# Patient Record
Sex: Female | Born: 1960 | Race: Black or African American | Hispanic: No | Smoking: Never smoker
Health system: Southern US, Community
[De-identification: ages and names within clinical notes are randomized; demographics above are authoritative.]

## PROBLEM LIST (undated history)

## (undated) DIAGNOSIS — Z800 Family history of malignant neoplasm of digestive organs: Secondary | ICD-10-CM

## (undated) DIAGNOSIS — I10 Essential (primary) hypertension: Secondary | ICD-10-CM

## (undated) DIAGNOSIS — Z803 Family history of malignant neoplasm of breast: Secondary | ICD-10-CM

## (undated) DIAGNOSIS — I499 Cardiac arrhythmia, unspecified: Secondary | ICD-10-CM

## (undated) DIAGNOSIS — Z8481 Family history of carrier of genetic disease: Secondary | ICD-10-CM

## (undated) HISTORY — PX: UTERINE FIBROID SURGERY: SHX826

## (undated) HISTORY — PX: HEMORROIDECTOMY: SUR656

## (undated) HISTORY — DX: Family history of carrier of genetic disease: Z84.81

## (undated) HISTORY — DX: Family history of malignant neoplasm of breast: Z80.3

## (undated) HISTORY — PX: REDUCTION MAMMAPLASTY: SUR839

## (undated) HISTORY — DX: Family history of malignant neoplasm of digestive organs: Z80.0

## (undated) HISTORY — PX: EYE SURGERY: SHX253

---

## 1997-11-25 ENCOUNTER — Ambulatory Visit (HOSPITAL_COMMUNITY): Admission: RE | Admit: 1997-11-25 | Discharge: 1997-11-25 | Payer: Self-pay | Admitting: Obstetrics and Gynecology

## 1998-03-11 ENCOUNTER — Inpatient Hospital Stay (HOSPITAL_COMMUNITY): Admission: RE | Admit: 1998-03-11 | Discharge: 1998-03-13 | Payer: Self-pay | Admitting: Obstetrics and Gynecology

## 1998-05-07 ENCOUNTER — Ambulatory Visit (HOSPITAL_COMMUNITY): Admission: RE | Admit: 1998-05-07 | Discharge: 1998-05-07 | Payer: Self-pay | Admitting: Gastroenterology

## 1998-09-03 ENCOUNTER — Emergency Department (HOSPITAL_COMMUNITY): Admission: EM | Admit: 1998-09-03 | Discharge: 1998-09-03 | Payer: Self-pay | Admitting: Emergency Medicine

## 1998-09-03 ENCOUNTER — Encounter: Payer: Self-pay | Admitting: Emergency Medicine

## 1998-09-25 ENCOUNTER — Encounter: Admission: RE | Admit: 1998-09-25 | Discharge: 1998-10-22 | Payer: Self-pay | Admitting: Specialist

## 1998-10-20 ENCOUNTER — Observation Stay (HOSPITAL_COMMUNITY): Admission: RE | Admit: 1998-10-20 | Discharge: 1998-10-21 | Payer: Self-pay | Admitting: General Surgery

## 1999-04-08 ENCOUNTER — Other Ambulatory Visit: Admission: RE | Admit: 1999-04-08 | Discharge: 1999-04-08 | Payer: Self-pay | Admitting: Obstetrics and Gynecology

## 2000-05-18 ENCOUNTER — Ambulatory Visit (HOSPITAL_COMMUNITY): Admission: RE | Admit: 2000-05-18 | Discharge: 2000-05-18 | Payer: Self-pay

## 2002-06-15 ENCOUNTER — Ambulatory Visit (HOSPITAL_COMMUNITY): Admission: RE | Admit: 2002-06-15 | Discharge: 2002-06-15 | Payer: Self-pay | Admitting: Family Medicine

## 2002-07-05 ENCOUNTER — Ambulatory Visit (HOSPITAL_COMMUNITY): Admission: RE | Admit: 2002-07-05 | Discharge: 2002-07-05 | Payer: Self-pay | Admitting: Obstetrics & Gynecology

## 2002-07-05 ENCOUNTER — Encounter: Payer: Self-pay | Admitting: Obstetrics & Gynecology

## 2002-09-19 ENCOUNTER — Encounter: Payer: Self-pay | Admitting: Family Medicine

## 2002-09-19 ENCOUNTER — Ambulatory Visit (HOSPITAL_COMMUNITY): Admission: RE | Admit: 2002-09-19 | Discharge: 2002-09-19 | Payer: Self-pay | Admitting: Family Medicine

## 2003-09-24 ENCOUNTER — Ambulatory Visit (HOSPITAL_COMMUNITY): Admission: RE | Admit: 2003-09-24 | Discharge: 2003-09-24 | Payer: Self-pay | Admitting: Obstetrics and Gynecology

## 2003-09-26 ENCOUNTER — Other Ambulatory Visit: Admission: RE | Admit: 2003-09-26 | Discharge: 2003-09-26 | Payer: Self-pay | Admitting: Family Medicine

## 2004-06-08 ENCOUNTER — Inpatient Hospital Stay (HOSPITAL_COMMUNITY): Admission: AD | Admit: 2004-06-08 | Discharge: 2004-06-08 | Payer: Self-pay | Admitting: Obstetrics & Gynecology

## 2005-08-11 ENCOUNTER — Encounter: Admission: RE | Admit: 2005-08-11 | Discharge: 2005-08-11 | Payer: Self-pay | Admitting: Obstetrics and Gynecology

## 2005-11-15 ENCOUNTER — Encounter: Admission: RE | Admit: 2005-11-15 | Discharge: 2005-11-15 | Payer: Self-pay | Admitting: Internal Medicine

## 2006-04-09 ENCOUNTER — Emergency Department (HOSPITAL_COMMUNITY): Admission: EM | Admit: 2006-04-09 | Discharge: 2006-04-09 | Payer: Self-pay | Admitting: Emergency Medicine

## 2006-12-29 ENCOUNTER — Encounter: Admission: RE | Admit: 2006-12-29 | Discharge: 2006-12-29 | Payer: Self-pay | Admitting: Obstetrics and Gynecology

## 2007-03-13 ENCOUNTER — Emergency Department (HOSPITAL_COMMUNITY): Admission: EM | Admit: 2007-03-13 | Discharge: 2007-03-13 | Payer: Self-pay | Admitting: *Deleted

## 2008-10-10 ENCOUNTER — Emergency Department (HOSPITAL_COMMUNITY): Admission: EM | Admit: 2008-10-10 | Discharge: 2008-10-10 | Payer: Self-pay | Admitting: Emergency Medicine

## 2009-10-29 ENCOUNTER — Encounter: Admission: RE | Admit: 2009-10-29 | Discharge: 2009-10-29 | Payer: Self-pay | Admitting: Obstetrics and Gynecology

## 2009-11-11 ENCOUNTER — Encounter: Admission: RE | Admit: 2009-11-11 | Discharge: 2009-11-11 | Payer: Self-pay | Admitting: Obstetrics and Gynecology

## 2009-11-13 ENCOUNTER — Encounter: Admission: RE | Admit: 2009-11-13 | Discharge: 2009-11-13 | Payer: Self-pay | Admitting: Obstetrics and Gynecology

## 2009-12-31 ENCOUNTER — Ambulatory Visit (HOSPITAL_COMMUNITY): Admission: RE | Admit: 2009-12-31 | Discharge: 2009-12-31 | Payer: Self-pay | Admitting: Interventional Radiology

## 2010-01-01 ENCOUNTER — Ambulatory Visit (HOSPITAL_COMMUNITY): Admission: RE | Admit: 2010-01-01 | Discharge: 2010-01-02 | Payer: Self-pay | Admitting: Interventional Radiology

## 2010-01-21 ENCOUNTER — Encounter: Admission: RE | Admit: 2010-01-21 | Discharge: 2010-01-21 | Payer: Self-pay | Admitting: Interventional Radiology

## 2010-07-18 ENCOUNTER — Encounter: Payer: Self-pay | Admitting: Internal Medicine

## 2010-07-19 ENCOUNTER — Encounter: Payer: Self-pay | Admitting: Interventional Radiology

## 2010-07-22 ENCOUNTER — Encounter
Admission: RE | Admit: 2010-07-22 | Discharge: 2010-07-22 | Payer: Self-pay | Source: Home / Self Care | Attending: Obstetrics and Gynecology | Admitting: Obstetrics and Gynecology

## 2010-07-22 ENCOUNTER — Encounter
Admission: RE | Admit: 2010-07-22 | Discharge: 2010-07-22 | Payer: Self-pay | Source: Home / Self Care | Attending: Interventional Radiology | Admitting: Interventional Radiology

## 2010-09-13 LAB — CBC
HCT: 39.5 % (ref 36.0–46.0)
Hemoglobin: 13.7 g/dL (ref 12.0–15.0)
MCH: 30.4 pg (ref 26.0–34.0)
MCHC: 34.7 g/dL (ref 30.0–36.0)
MCV: 87.8 fL (ref 78.0–100.0)
Platelets: 247 10*3/uL (ref 150–400)
RBC: 4.49 MIL/uL (ref 3.87–5.11)
RDW: 12.3 % (ref 11.5–15.5)
WBC: 4.6 10*3/uL (ref 4.0–10.5)

## 2010-09-13 LAB — HCG, SERUM, QUALITATIVE: Preg, Serum: NEGATIVE

## 2010-09-13 LAB — CREATININE, SERUM
Creatinine, Ser: 0.74 mg/dL (ref 0.4–1.2)
GFR calc Af Amer: >60 mL/min (ref >60)
GFR calc non Af Amer: >60 mL/min (ref >60)

## 2010-10-07 LAB — DIFFERENTIAL
Basophils Absolute: 0 10*3/uL (ref 0.0–0.1)
Basophils Relative: 0 % (ref 0–1)
Eosinophils Absolute: 0.1 10*3/uL (ref 0.0–0.7)
Eosinophils Relative: 1 % (ref 0–5)
Lymphocytes Relative: 15 % (ref 12–46)
Lymphs Abs: 1.1 10*3/uL (ref 0.7–4.0)
Monocytes Absolute: 0.8 10*3/uL (ref 0.1–1.0)
Monocytes Relative: 10 % (ref 3–12)
Neutro Abs: 5.7 10*3/uL (ref 1.7–7.7)
Neutrophils Relative %: 74 % (ref 43–77)

## 2010-10-07 LAB — COMPREHENSIVE METABOLIC PANEL
ALT: 30 U/L (ref 0–35)
AST: 18 U/L (ref 0–37)
Albumin: 3.3 g/dL — ABNORMAL LOW (ref 3.5–5.2)
Alkaline Phosphatase: 105 U/L (ref 39–117)
BUN: 11 mg/dL (ref 6–23)
CO2: 26 mEq/L (ref 19–32)
Calcium: 8.7 mg/dL (ref 8.4–10.5)
Chloride: 106 mEq/L (ref 96–112)
Creatinine, Ser: 0.76 mg/dL (ref 0.4–1.2)
GFR calc Af Amer: >60 mL/min (ref >60)
GFR calc non Af Amer: >60 mL/min (ref >60)
Glucose, Bld: 103 mg/dL — ABNORMAL HIGH (ref 70–99)
Potassium: 3.3 mEq/L — ABNORMAL LOW (ref 3.5–5.1)
Sodium: 139 mEq/L (ref 135–145)
Total Bilirubin: 0.6 mg/dL (ref 0.3–1.2)
Total Protein: 6.4 g/dL (ref 6.0–8.3)

## 2010-10-07 LAB — URINE MICROSCOPIC-ADD ON

## 2010-10-07 LAB — URINALYSIS, ROUTINE W REFLEX MICROSCOPIC
Bilirubin Urine: NEGATIVE
Glucose, UA: NEGATIVE mg/dL
Hgb urine dipstick: NEGATIVE
Ketones, ur: NEGATIVE mg/dL
Nitrite: NEGATIVE
Protein, ur: NEGATIVE mg/dL
Specific Gravity, Urine: 1.017 (ref 1.005–1.030)
Urobilinogen, UA: 0.2 mg/dL (ref 0.0–1.0)
pH: 6 (ref 5.0–8.0)

## 2010-10-07 LAB — CBC
HCT: 37.5 % (ref 36.0–46.0)
Hemoglobin: 12.9 g/dL (ref 12.0–15.0)
MCHC: 34.3 g/dL (ref 30.0–36.0)
MCV: 87.8 fL (ref 78.0–100.0)
Platelets: 225 10*3/uL (ref 150–400)
RBC: 4.27 MIL/uL (ref 3.87–5.11)
RDW: 12.4 % (ref 11.5–15.5)
WBC: 7.7 10*3/uL (ref 4.0–10.5)

## 2010-10-07 LAB — WET PREP, GENITAL
Clue Cells Wet Prep HPF POC: NONE SEEN
Trich, Wet Prep: NONE SEEN
Yeast Wet Prep HPF POC: NONE SEEN

## 2010-10-07 LAB — PREGNANCY, URINE: Preg Test, Ur: NEGATIVE

## 2010-10-07 LAB — GC/CHLAMYDIA PROBE AMP, GENITAL
Chlamydia, DNA Probe: NEGATIVE
GC Probe Amp, Genital: NEGATIVE

## 2010-10-19 ENCOUNTER — Other Ambulatory Visit (HOSPITAL_COMMUNITY): Payer: Self-pay | Admitting: Obstetrics and Gynecology

## 2010-10-19 ENCOUNTER — Other Ambulatory Visit: Payer: Self-pay | Admitting: Obstetrics and Gynecology

## 2010-10-19 DIAGNOSIS — Z1231 Encounter for screening mammogram for malignant neoplasm of breast: Secondary | ICD-10-CM

## 2010-11-02 ENCOUNTER — Ambulatory Visit (HOSPITAL_COMMUNITY): Payer: Self-pay

## 2010-11-02 ENCOUNTER — Ambulatory Visit
Admission: RE | Admit: 2010-11-02 | Discharge: 2010-11-02 | Disposition: A | Discharge status: Home / Self Care | Payer: BC Managed Care – PPO | Source: Ambulatory Visit | Attending: Obstetrics and Gynecology | Admitting: Obstetrics and Gynecology

## 2010-11-02 DIAGNOSIS — Z1231 Encounter for screening mammogram for malignant neoplasm of breast: Secondary | ICD-10-CM

## 2011-01-14 ENCOUNTER — Inpatient Hospital Stay (INDEPENDENT_AMBULATORY_CARE_PROVIDER_SITE_OTHER)
Admission: RE | Admit: 2011-01-14 | Discharge: 2011-01-14 | Disposition: A | Discharge status: Home / Self Care | Payer: BC Managed Care – PPO | Source: Ambulatory Visit | Attending: Emergency Medicine | Admitting: Emergency Medicine

## 2011-01-14 DIAGNOSIS — R51 Headache: Secondary | ICD-10-CM

## 2011-01-14 DIAGNOSIS — I10 Essential (primary) hypertension: Secondary | ICD-10-CM

## 2012-12-20 ENCOUNTER — Other Ambulatory Visit: Payer: Self-pay

## 2014-02-14 ENCOUNTER — Other Ambulatory Visit (HOSPITAL_COMMUNITY)
Admission: RE | Admit: 2014-02-14 | Discharge: 2014-02-14 | Disposition: A | Discharge status: Home / Self Care | Payer: BC Managed Care – PPO | Source: Ambulatory Visit | Attending: Family Medicine | Admitting: Family Medicine

## 2014-02-14 DIAGNOSIS — Z01419 Encounter for gynecological examination (general) (routine) without abnormal findings: Secondary | ICD-10-CM | POA: Diagnosis present

## 2014-02-14 DIAGNOSIS — Z113 Encounter for screening for infections with a predominantly sexual mode of transmission: Secondary | ICD-10-CM | POA: Insufficient documentation

## 2014-02-14 DIAGNOSIS — N760 Acute vaginitis: Secondary | ICD-10-CM | POA: Diagnosis present

## 2014-02-14 DIAGNOSIS — Z1151 Encounter for screening for human papillomavirus (HPV): Secondary | ICD-10-CM | POA: Insufficient documentation

## 2014-03-09 ENCOUNTER — Other Ambulatory Visit: Payer: BC Managed Care – PPO | Admitting: Family Medicine

## 2014-03-09 ENCOUNTER — Other Ambulatory Visit (HOSPITAL_COMMUNITY)
Admission: RE | Admit: 2014-03-09 | Discharge: 2014-03-09 | Disposition: A | Discharge status: Home / Self Care | Payer: BC Managed Care – PPO | Source: Ambulatory Visit | Attending: Family Medicine | Admitting: Family Medicine

## 2014-03-09 DIAGNOSIS — N760 Acute vaginitis: Secondary | ICD-10-CM | POA: Diagnosis present

## 2014-03-09 DIAGNOSIS — Z113 Encounter for screening for infections with a predominantly sexual mode of transmission: Secondary | ICD-10-CM | POA: Insufficient documentation

## 2014-03-18 ENCOUNTER — Other Ambulatory Visit: Payer: Self-pay

## 2014-03-18 DIAGNOSIS — N644 Mastodynia: Secondary | ICD-10-CM

## 2014-03-18 LAB — URINE CYTOLOGY ANCILLARY ONLY
Bacterial vaginitis: NEGATIVE
Candida vaginitis: NEGATIVE
Chlamydia: NEGATIVE
Neisseria Gonorrhea: NEGATIVE
Trichomonas: NEGATIVE

## 2014-04-08 ENCOUNTER — Other Ambulatory Visit: Payer: Self-pay

## 2014-04-08 DIAGNOSIS — Z1231 Encounter for screening mammogram for malignant neoplasm of breast: Secondary | ICD-10-CM

## 2014-04-18 ENCOUNTER — Other Ambulatory Visit: Payer: Self-pay

## 2014-04-18 DIAGNOSIS — Z1231 Encounter for screening mammogram for malignant neoplasm of breast: Secondary | ICD-10-CM

## 2014-04-22 ENCOUNTER — Ambulatory Visit: Payer: BC Managed Care – PPO

## 2014-04-24 ENCOUNTER — Ambulatory Visit
Admission: RE | Admit: 2014-04-24 | Discharge: 2014-04-24 | Disposition: A | Discharge status: Home / Self Care | Payer: BC Managed Care – PPO | Source: Ambulatory Visit

## 2014-04-24 DIAGNOSIS — Z1231 Encounter for screening mammogram for malignant neoplasm of breast: Secondary | ICD-10-CM

## 2014-10-21 ENCOUNTER — Other Ambulatory Visit: Payer: BLUE CROSS/BLUE SHIELD | Admitting: Family Medicine

## 2014-10-21 ENCOUNTER — Other Ambulatory Visit (HOSPITAL_COMMUNITY)
Admission: RE | Admit: 2014-10-21 | Discharge: 2014-10-21 | Disposition: A | Discharge status: Home / Self Care | Payer: BLUE CROSS/BLUE SHIELD | Source: Ambulatory Visit | Attending: Family Medicine | Admitting: Family Medicine

## 2014-10-21 DIAGNOSIS — N760 Acute vaginitis: Secondary | ICD-10-CM | POA: Insufficient documentation

## 2014-10-21 DIAGNOSIS — Z113 Encounter for screening for infections with a predominantly sexual mode of transmission: Secondary | ICD-10-CM | POA: Diagnosis present

## 2014-10-23 LAB — URINE CYTOLOGY ANCILLARY ONLY
Bacterial vaginitis: POSITIVE — AB
Candida vaginitis: NEGATIVE
Chlamydia: NEGATIVE
Neisseria Gonorrhea: NEGATIVE
Trichomonas: NEGATIVE

## 2016-04-13 ENCOUNTER — Other Ambulatory Visit: Payer: Self-pay | Admitting: Family Medicine

## 2016-04-13 DIAGNOSIS — Z1231 Encounter for screening mammogram for malignant neoplasm of breast: Secondary | ICD-10-CM

## 2016-05-03 ENCOUNTER — Ambulatory Visit
Admission: RE | Admit: 2016-05-03 | Discharge: 2016-05-03 | Disposition: A | Discharge status: Home / Self Care | Payer: BLUE CROSS/BLUE SHIELD | Source: Ambulatory Visit | Attending: Family Medicine | Admitting: Family Medicine

## 2016-05-03 DIAGNOSIS — Z1231 Encounter for screening mammogram for malignant neoplasm of breast: Secondary | ICD-10-CM

## 2016-05-31 ENCOUNTER — Emergency Department (HOSPITAL_COMMUNITY)
Admission: EM | Admit: 2016-05-31 | Discharge: 2016-05-31 | Disposition: A | Discharge status: Home / Self Care | Payer: BLUE CROSS/BLUE SHIELD | Attending: Physician Assistant | Admitting: Physician Assistant

## 2016-05-31 ENCOUNTER — Encounter (HOSPITAL_COMMUNITY): Payer: Self-pay | Admitting: Emergency Medicine

## 2016-05-31 DIAGNOSIS — Y9389 Activity, other specified: Secondary | ICD-10-CM | POA: Diagnosis not present

## 2016-05-31 DIAGNOSIS — Y999 Unspecified external cause status: Secondary | ICD-10-CM | POA: Insufficient documentation

## 2016-05-31 DIAGNOSIS — S0083XA Contusion of other part of head, initial encounter: Secondary | ICD-10-CM | POA: Diagnosis not present

## 2016-05-31 DIAGNOSIS — S0990XA Unspecified injury of head, initial encounter: Secondary | ICD-10-CM

## 2016-05-31 DIAGNOSIS — Y929 Unspecified place or not applicable: Secondary | ICD-10-CM | POA: Diagnosis not present

## 2016-05-31 DIAGNOSIS — W228XXA Striking against or struck by other objects, initial encounter: Secondary | ICD-10-CM | POA: Diagnosis not present

## 2016-05-31 DIAGNOSIS — Z23 Encounter for immunization: Secondary | ICD-10-CM | POA: Insufficient documentation

## 2016-05-31 MED ORDER — OXYCODONE-ACETAMINOPHEN 5-325 MG PO TABS
1.0000 | ORAL_TABLET | Freq: Once | ORAL | Status: AC
  Administered 2016-05-31: 1 via ORAL

## 2016-05-31 MED ORDER — OXYCODONE-ACETAMINOPHEN 5-325 MG PO TABS
ORAL_TABLET | ORAL | Status: AC
  Filled 2016-05-31: qty 1

## 2016-05-31 MED ORDER — TETANUS-DIPHTH-ACELL PERTUSSIS 5-2.5-18.5 LF-MCG/0.5 IM SUSP
0.5000 mL | Freq: Once | INTRAMUSCULAR | Status: AC
  Administered 2016-05-31: 0.5 mL via INTRAMUSCULAR
  Filled 2016-05-31: qty 0.5

## 2016-05-31 NOTE — Discharge Instructions (Signed)
Please return with any headaches vomiting or other concerns.

## 2016-05-31 NOTE — ED Provider Notes (Addendum)
[facility] DEPT Provider Note   CSN: [id number] Arrival date & time: 05/31/16  1535  By signing my name below, I, Carrie Murillo, attest that this documentation has been prepared under the direction and in the presence of Courteney Lyn Mackuen, MD. Electronically Signed: Soijett Murillo, ED Scribe. 05/31/16. 7:41 PM.   History   Chief Complaint Chief Complaint  Patient presents with  . Head Injury    HPI Carrie Murillo is a 55 y.o. female who presents to the Emergency Department complaining of head injury occurring PTA. Pt notes that she was cleaning and when she lifted up she struck her left sided head on the cabinet. Pt states that her hematoma has mildly resolved since the initial incident and she showed a picture to the provider of the initial hematoma. Pt is having associated symptoms of mildly resolved left sided hematoma to forehead. She notes that she has not tried any medications for the relief of her symptoms. She denies vomiting, LOC, confusion, bruise, and any other symptoms. Pt denies taking blood thinners at this time. Pt notes that she has a hx of HTN.   The history is provided by the patient. No language interpreter was used.    History reviewed. No pertinent past medical history.  There are no active problems to display for this patient.   History reviewed. No pertinent surgical history.  OB History    No data available       Home Medications    Prior to Admission medications   Medication Sig Start Date End Date Taking? Authorizing Provider  bimatoprost (LUMIGAN) 0.03 % ophthalmic solution Place 1 drop into both eyes at bedtime.   Yes Historical Provider, MD    Family History History reviewed. No pertinent family history.  Social History Social History  Substance Use Topics  . Smoking status: Never Smoker  . Smokeless tobacco: Not on file  . Alcohol use No     Allergies   Patient has no known allergies.   Review of Systems Review of Systems    HENT:       Hematoma to left forehead  Gastrointestinal: Negative for vomiting.  Skin: Positive for color change. Negative for wound.  Neurological: Positive for headaches. Negative for syncope.  Psychiatric/Behavioral: Negative for confusion.     Physical Exam Updated Vital Signs BP 199/87 (BP Location: Right Arm)   Pulse (!) 53   Temp 98.3 F (36.8 C) (Oral)   Resp 17   SpO2 100%   Physical Exam  Constitutional: She is oriented to person, place, and time. She appears well-developed and well-nourished. No distress.  HENT:  Head: Normocephalic.  Less than 2 cm bruise to left frontal bone.   Eyes: EOM are normal.  Neck: Neck supple.  Cardiovascular: Normal rate, regular rhythm and normal heart sounds.  Exam reveals no gallop and no friction rub.   No murmur heard. Pulmonary/Chest: Effort normal and breath sounds normal. No respiratory distress. She has no wheezes. She has no rales.  Abdominal: She exhibits no distension.  Musculoskeletal: Normal range of motion.  Neurological: She is alert and oriented to person, place, and time.  Negative pronator drift.   Skin: Skin is warm and dry.  Psychiatric: She has a normal mood and affect. Her behavior is normal.  Nursing note and vitals reviewed.    ED Treatments / Results  DIAGNOSTIC STUDIES: Oxygen Saturation is 100% on RA, nl by my interpretation.    COORDINATION OF CARE: 7:40 PM Discussed treatment plan  with pt at bedside which includes update tetanus vaccination, percocet, and pt agreed to plan.   Procedures Procedures (including critical care time)  Medications Ordered in ED Medications  oxyCODONE-acetaminophen (PERCOCET/ROXICET) 5-325 MG per tablet (not administered)  oxyCODONE-acetaminophen (PERCOCET/ROXICET) 5-325 MG per tablet 1 tablet (1 tablet Oral Given 05/31/16 1607)     Initial Impression / Assessment and Plan / ED Course  I have reviewed the triage vital signs and the nursing notes.   Clinical  Course     I personally performed the services described in this documentation, which was scribed in my presence. The recorded information has been reviewed and is accurate.    Patient's very pleasant 55 year old female presenting with head injury prior to arrival.  Patient hit her head against cabinet. Patient had large hematoma initially. That is why she came to the emergency department. However this since resolved. Now she only has a tiny bruise to her left frontal bone. Patient has normal cranial nerve exam, no LOC, no other external signs of trauma. No vomiting. We had patient centered discussion about CT versus not. We both agreed that she is low risk and we will not CT at this time.    Final Clinical Impressions(s) / ED Diagnoses   Final diagnoses:  None    New Prescriptions New Prescriptions   No medications on file     Courteney Julio Alm, MD 05/31/16 1944    Courteney Julio Alm, MD 05/31/16 2018

## 2016-05-31 NOTE — ED Triage Notes (Signed)
Pt arrives via POv from home with injury to head after hitting it on the cabinet. Denies LOC, blood thinners. Pt alert, oriented x4, large hematoma to left forehead.

## 2017-04-07 ENCOUNTER — Other Ambulatory Visit: Payer: Self-pay | Admitting: Family Medicine

## 2017-04-07 DIAGNOSIS — Z1231 Encounter for screening mammogram for malignant neoplasm of breast: Secondary | ICD-10-CM

## 2017-05-05 ENCOUNTER — Ambulatory Visit
Admission: RE | Admit: 2017-05-05 | Discharge: 2017-05-05 | Disposition: A | Discharge status: Home / Self Care | Payer: BLUE CROSS/BLUE SHIELD | Source: Ambulatory Visit | Attending: Family Medicine | Admitting: Family Medicine

## 2017-05-05 DIAGNOSIS — Z1231 Encounter for screening mammogram for malignant neoplasm of breast: Secondary | ICD-10-CM

## 2017-08-03 ENCOUNTER — Ambulatory Visit (HOSPITAL_COMMUNITY)
Admission: EM | Admit: 2017-08-03 | Discharge: 2017-08-03 | Disposition: A | Discharge status: Home / Self Care | Payer: BLUE CROSS/BLUE SHIELD | Attending: Family Medicine | Admitting: Family Medicine

## 2017-08-03 ENCOUNTER — Encounter (HOSPITAL_COMMUNITY): Payer: Self-pay | Admitting: Emergency Medicine

## 2017-08-03 ENCOUNTER — Other Ambulatory Visit: Payer: Self-pay

## 2017-08-03 DIAGNOSIS — L309 Dermatitis, unspecified: Secondary | ICD-10-CM | POA: Diagnosis not present

## 2017-08-03 HISTORY — DX: Essential (primary) hypertension: I10

## 2017-08-03 MED ORDER — AMOXICILLIN-POT CLAVULANATE 875-125 MG PO TABS: 1.0000 | ORAL_TABLET | Freq: Two times a day (BID) | ORAL | 0 refills | Status: DC

## 2017-08-03 MED ORDER — PREDNISONE 10 MG (21) PO TBPK: ORAL_TABLET | Freq: Every day | ORAL | 0 refills | Status: DC

## 2017-08-03 NOTE — ED Triage Notes (Signed)
Pt. Stated, I think I was bitten by a spider on my upper thigh.

## 2017-08-04 NOTE — ED Provider Notes (Signed)
  [name]   [id number] 08/03/17 Arrival Time: 1660  ASSESSMENT & PLAN:  1. Dermatitis     Meds ordered this encounter  Medications  . predniSONE (STERAPRED UNI-PAK 21 TAB) 10 MG (21) TBPK tablet    Sig: Take by mouth daily. Take as directed.    Dispense:  21 tablet    Refill:  0  . amoxicillin-clavulanate (AUGMENTIN) 875-125 MG tablet    Sig: Take 1 tablet by mouth every 12 (twelve) hours.    Dispense:  14 tablet    Refill:  0   Will take prednisone first and watch for 24 hours. If not seeing significant improvement will start taking Augmentin in addition. Benadryl if needed. May f/u as needed.  Reviewed expectations re: course of current medical issues. Questions answered. Outlined signs and symptoms indicating need for more acute intervention. Patient verbalized understanding. After Visit Summary given.   SUBJECTIVE:  Carrie Murillo is a 57 y.o. female who presents with complaint of possible insect bite to her R upper lateral thigh. Noticed yesterday. Slight enlargement today. Associated erythema and Itching. No other lesions or similar areas. Afebrile. Ambulatory without difficulty. No specific aggravating or alleviating factors reported. No OTC treatment.  ROS: As per HPI.  OBJECTIVE: Vitals:   08/03/17 1711  BP: (!) 170/100  Pulse: 68  Resp: 17  Temp: (!) 97.5 F (36.4 C)  TempSrc: Oral  SpO2: 99%  Weight: 164 lb (74.4 kg)  Height: 5\' 3"  (1.6 m)    General appearance: alert; no distress Lungs: clear to auscultation bilaterally Heart: regular rate and rhythm Extremities: no edema Skin: warm and dry; wheal like area on R lateral thigh, approx 6x6 cm; mild tenderness and warmth; erythematous Psychological: alert and cooperative; normal mood and affect  No Known Allergies  Past Medical History:  Diagnosis Date  . Hypertension    Social History   Socioeconomic History  . Marital status: Single    Spouse name: Not on file  . Number of  children: Not on file  . Years of education: Not on file  . Highest education level: Not on file  Social Needs  . Financial resource strain: Not on file  . Food insecurity - worry: Not on file  . Food insecurity - inability: Not on file  . Transportation needs - medical: Not on file  . Transportation needs - non-medical: Not on file  Occupational History  . Not on file  Tobacco Use  . Smoking status: Never Smoker  . Smokeless tobacco: Never Used  Substance and Sexual Activity  . Alcohol use: No  . Drug use: No  . Sexual activity: Not on file  Other Topics Concern  . Not on file  Social History Narrative  . Not on file   Family History  Problem Relation Age of Onset  . Breast cancer Mother   . Breast cancer Maternal Aunt   . Breast cancer Cousin    History reviewed. No pertinent surgical history.   Carrie Kick, MD 08/04/17 [phone]

## 2017-10-13 ENCOUNTER — Ambulatory Visit (HOSPITAL_COMMUNITY)
Admission: EM | Admit: 2017-10-13 | Discharge: 2017-10-13 | Disposition: A | Discharge status: Home / Self Care | Payer: BLUE CROSS/BLUE SHIELD | Attending: Family Medicine | Admitting: Family Medicine

## 2017-10-13 ENCOUNTER — Encounter (HOSPITAL_COMMUNITY): Payer: Self-pay | Admitting: Family Medicine

## 2017-10-13 DIAGNOSIS — M25571 Pain in right ankle and joints of right foot: Secondary | ICD-10-CM

## 2017-10-13 MED ORDER — NAPROXEN 500 MG PO TABS: 500.0000 mg | ORAL_TABLET | Freq: Two times a day (BID) | ORAL | 0 refills | Status: AC

## 2017-10-13 MED ORDER — CETIRIZINE HCL 10 MG PO TABS: 10.0000 mg | ORAL_TABLET | Freq: Every day | ORAL | 0 refills | Status: DC

## 2017-10-13 MED ORDER — AMOXICILLIN-POT CLAVULANATE 875-125 MG PO TABS: 1.0000 | ORAL_TABLET | Freq: Two times a day (BID) | ORAL | 0 refills | Status: DC

## 2017-10-13 NOTE — Discharge Instructions (Addendum)
I am covering you for skin infection due to the swelling, redness, increased warmth and pain.  Start Augmentin as directed.  As discussed, this could also be due to inflammation from the insect bite.  Start Zyrtec for itchiness.  Naproxen as directed for pain.  Monitor for any spreading redness, increased warmth, increased pain, fever, follow-up for reevaluation.

## 2017-10-13 NOTE — ED Triage Notes (Signed)
Pt here for insect bites and swelling to the right leg. Also bites to the arm.

## 2017-10-13 NOTE — ED Provider Notes (Signed)
[facility]    CSN: [id number] Arrival date & time: 10/13/17  1904     History   Chief Complaint Chief Complaint  Patient presents with  . Ankle Pain  . Insect Bite    HPI Carrie Murillo is a 57 y.o. female.   57 year old female comes in for right ankle pain and insect bite.  States she thinks she got bit by an insect 2 days ago, has had itching which caused her to scratch.  However, starting today, has had severe pain.  Area is tender to touch.  Denies fever, chills, night sweats.  Has not taken anything for the symptoms.  Decreased range of motion of the ankle due to the pain and swelling.  Denies history of gout.     Past Medical History:  Diagnosis Date  . Hypertension     There are no active problems to display for this patient.   History reviewed. No pertinent surgical history.  OB History   None      Home Medications    Prior to Admission medications   Medication Sig Start Date End Date Taking? Authorizing Provider  amoxicillin-clavulanate (AUGMENTIN) 875-125 MG tablet Take 1 tablet by mouth every 12 (twelve) hours. 10/13/17   Tasia Catchings, Amy V, PA-C  bimatoprost (LUMIGAN) 0.03 % ophthalmic solution Place 1 drop into both eyes at bedtime.    [provider]  cetirizine (ZYRTEC) 10 MG tablet Take 1 tablet (10 mg total) by mouth daily. 10/13/17   Tasia Catchings, Amy V, PA-C  naproxen (NAPROSYN) 500 MG tablet Take 1 tablet (500 mg total) by mouth 2 (two) times daily for 10 days. 10/13/17 10/23/17  Ok Edwards, PA-C    Family History Family History  Problem Relation Age of Onset  . Breast cancer Mother   . Breast cancer Maternal Aunt   . Breast cancer Cousin     Social History Social History   Tobacco Use  . Smoking status: Never Smoker  . Smokeless tobacco: Never Used  Substance Use Topics  . Alcohol use: No  . Drug use: No     Allergies   Patient has no known allergies.   Review of Systems Review of Systems  Reason unable to perform ROS: See  HPI as above.     Physical Exam Triage Vital Signs ED Triage Vitals  Enc Vitals Group     BP 10/13/17 2002 (!) 147/97     Pulse Rate 10/13/17 2002 93     Resp 10/13/17 2002 18     Temp 10/13/17 2002 98.6 F (37 C)     Temp src --      SpO2 10/13/17 2002 100 %     Weight --      Height --      Head Circumference --      Peak Flow --      Pain Score 10/13/17 2001 10     Pain Loc --      Pain Edu? --      Excl. in [place]? --    No data found.  Updated Vital Signs BP (!) 147/97   Pulse 93   Temp 98.6 F (37 C)   Resp 18   SpO2 100%       Physical Exam  Constitutional: She is oriented to person, place, and time. She appears well-developed and well-nourished. No distress.  HENT:  Head: Normocephalic and atraumatic.  Eyes: Pupils are equal, round, and reactive to light. Conjunctivae are normal.  Musculoskeletal:  Right ankle swelling to lateral side with erythema, increased warmth.  Patient tender to light touch.  No obvious wound seen.  Decreased range of motion due to pain and swelling.  Pedal pulse 2+ and equal bilaterally.  Neurological: She is alert and oriented to person, place, and time.  Skin: Skin is warm and dry.     UC Treatments / Results  Labs (all labs ordered are listed, but only abnormal results are displayed) Labs Reviewed - No data to display  EKG None Radiology No results found.  Procedures Procedures (including critical care time)  Medications Ordered in UC Medications - No data to display   Initial Impression / Assessment and Plan / UC Course  I have reviewed the triage vital signs and the nursing notes.  Pertinent labs & imaging results that were available during my care of the patient were reviewed by me and considered in my medical decision making (see chart for details).    Discussed with patient symptoms could be due to inflammation from insect bite.  However, given tenderness to light touch, increased swelling, erythema,  increased warmth, will treat for possible cellulitis.  Start Augmentin as directed.  Naproxen and Zyrtec as directed.  Return precautions given.  Patient expresses understanding and agrees to plan.  Final Clinical Impressions(s) / UC Diagnoses   Final diagnoses:  Acute right ankle pain    ED Discharge Orders        Ordered    amoxicillin-clavulanate (AUGMENTIN) 875-125 MG tablet  Every 12 hours     10/13/17 2022    naproxen (NAPROSYN) 500 MG tablet  2 times daily     10/13/17 2023    cetirizine (ZYRTEC) 10 MG tablet  Daily     10/13/17 2023        Ok Edwards, PA-C 10/13/17 2028

## 2018-02-03 ENCOUNTER — Other Ambulatory Visit: Payer: Self-pay | Admitting: Family Medicine

## 2018-02-03 DIAGNOSIS — Z1231 Encounter for screening mammogram for malignant neoplasm of breast: Secondary | ICD-10-CM

## 2018-03-27 ENCOUNTER — Other Ambulatory Visit: Payer: Self-pay | Admitting: Obstetrics and Gynecology

## 2018-03-27 ENCOUNTER — Other Ambulatory Visit (HOSPITAL_COMMUNITY)
Admission: RE | Admit: 2018-03-27 | Discharge: 2018-03-27 | Disposition: A | Discharge status: Home / Self Care | Payer: BLUE CROSS/BLUE SHIELD | Source: Ambulatory Visit | Attending: Obstetrics and Gynecology | Admitting: Obstetrics and Gynecology

## 2018-03-27 DIAGNOSIS — N644 Mastodynia: Secondary | ICD-10-CM

## 2018-03-27 DIAGNOSIS — Z01411 Encounter for gynecological examination (general) (routine) with abnormal findings: Secondary | ICD-10-CM | POA: Insufficient documentation

## 2018-03-28 LAB — CYTOLOGY - PAP
Diagnosis: NEGATIVE
HPV: NOT DETECTED

## 2018-03-31 ENCOUNTER — Ambulatory Visit
Admission: RE | Admit: 2018-03-31 | Discharge: 2018-03-31 | Disposition: A | Discharge status: Home / Self Care | Payer: BLUE CROSS/BLUE SHIELD | Source: Ambulatory Visit | Attending: Obstetrics and Gynecology | Admitting: Obstetrics and Gynecology

## 2018-03-31 DIAGNOSIS — N644 Mastodynia: Secondary | ICD-10-CM

## 2018-04-10 ENCOUNTER — Telehealth: Payer: Self-pay | Admitting: Genetics

## 2018-04-10 ENCOUNTER — Encounter: Payer: Self-pay | Admitting: Genetics

## 2018-04-10 NOTE — Telephone Encounter (Signed)
New genetic counseling referral received from Dr. Landry Mellow for family history of breast cancer. A genetic counseling appt has been scheduled for the pt to see Ferol Luz on 11/7 at 4pm. Letter mailed to the pt.

## 2018-05-04 ENCOUNTER — Inpatient Hospital Stay: Payer: BLUE CROSS/BLUE SHIELD | Attending: Genetic Counselor | Admitting: Genetics

## 2018-05-04 ENCOUNTER — Inpatient Hospital Stay: Payer: BLUE CROSS/BLUE SHIELD

## 2018-05-04 DIAGNOSIS — Z8481 Family history of carrier of genetic disease: Secondary | ICD-10-CM

## 2018-05-04 DIAGNOSIS — Z803 Family history of malignant neoplasm of breast: Secondary | ICD-10-CM | POA: Diagnosis not present

## 2018-05-04 DIAGNOSIS — Z800 Family history of malignant neoplasm of digestive organs: Secondary | ICD-10-CM | POA: Diagnosis not present

## 2018-05-05 ENCOUNTER — Encounter: Payer: Self-pay | Admitting: Genetics

## 2018-05-05 DIAGNOSIS — Z8481 Family history of carrier of genetic disease: Secondary | ICD-10-CM | POA: Insufficient documentation

## 2018-05-05 DIAGNOSIS — Z800 Family history of malignant neoplasm of digestive organs: Secondary | ICD-10-CM | POA: Insufficient documentation

## 2018-05-05 DIAGNOSIS — Z803 Family history of malignant neoplasm of breast: Secondary | ICD-10-CM | POA: Insufficient documentation

## 2018-05-05 NOTE — Progress Notes (Addendum)
REFERRING PROVIDER: Christophe Louis, MD [place] [place], [place] [address]  PRIMARY PROVIDER:  Lucianne Lei, MD  PRIMARY REASON FOR VISIT:  1. Family history of genetic disease carrier   2. Family history of breast cancer   3. Family history of colon cancer    HISTORY OF PRESENT ILLNESS:   Carrie Murillo, a 57 y.o. female, was seen for a [facility] cancer genetics consultation at the request of Dr. Landry Mellow due to a family history of cancer.  Carrie Murillo presents to clinic today to discuss the possibility of a hereditary predisposition to cancer, genetic testing, and to further clarify her future cancer risks, as well as potential cancer risks for family members.   Carrie Murillo is a 57 y.o. female with no personal history of cancer.    HORMONAL RISK FACTORS:  Menarche was at age 50.  First live birth at age N/A.  Ovaries intact: yes. 1 tube removed Hysterectomy: no.  Menopausal status: postmenopausal.  HRT use: 0 years. Colonoscopy: yes; pt reports no polyps, report unavaialbe for review. Mammogram within the last year: yes. Number of breast biopsies: 0.  Past Medical History:  Diagnosis Date  . Family history of breast cancer   . Family history of colon cancer   . Family history of genetic disease carrier   . Hypertension     No past surgical history on file.  Social History   Socioeconomic History  . Marital status: Single    Spouse name: Not on file  . Number of children: Not on file  . Years of education: Not on file  . Highest education level: Not on file  Occupational History  . Not on file  Social Needs  . Financial resource strain: Not on file  . Food insecurity:    Worry: Not on file    Inability: Not on file  . Transportation needs:    Medical: Not on file    Non-medical: Not on file  Tobacco Use  . Smoking status: Never Smoker  . Smokeless tobacco: Never Used  Substance and Sexual Activity  . Alcohol use: No  . Drug use: No  . Sexual  activity: Not on file  Lifestyle  . Physical activity:    Days per week: Not on file    Minutes per session: Not on file  . Stress: Not on file  Relationships  . Social connections:    Talks on phone: Not on file    Gets together: Not on file    Attends religious service: Not on file    Active member of club or organization: Not on file    Attends meetings of clubs or organizations: Not on file    Relationship status: Not on file  Other Topics Concern  . Not on file  Social History Narrative  . Not on file     FAMILY HISTORY:  We obtained a detailed, 4-generation family history.  Significant diagnoses are listed below: Family History  Problem Relation Age of Onset  . Breast cancer Mother 32       dx late 03-Sep-2022, died at 29  . Breast cancer Maternal Aunt 28       died at 64  . Breast cancer Cousin 79  . Colon cancer Paternal Aunt        dx over 74  . Colon cancer Paternal Uncle        dx over 75  . Other Other  genetic test positive for a mutation- patient does not know which mutation  . Cancer Maternal Aunt        5 other maternal aunts all with cancer- types unk    Carrie Murillo has no children, she has a history of miscarriage.  Carrie Murillo has a brother who is 22 with no history of cancer and a full sister who is in her early 48's with no children and no history of cancer.  Carrie Murillo also has a paternal half-sister who is 40 with no hx of cancer.   Carrie Murillo father: died in his 73's with no hx of cancer.  Paternal Aunts/Uncles: 2 paternal aunts (1 developed colon cancer older than 79), and 4 paternal uncles, 1 of which also developed colon cancer older than 29.  Paternal cousins: no known history of cancer Paternal grandfather: unk Paternal grandmother:unk  Carrie Murillo's mother: dx with breast cancer in her late 09-10-22, died at 14.   Maternal Aunts/Uncles: mother's identical twin sister also dx with breast cancer in late 09/10/2022, she died at 82.  This aunt's daughter  (patient's cousin) had breast cancer at 87, and granddaughter (patient's cousin 1x removed) had genetic testing that was positive for a 'cancer mutation'.  The report is unavailable for review- Carrie Murillo says she caould try to get a copy, but currently does not have the report.    7 other maternal aunts, 1 had breast cancer in her 72's, 5 others had cancer, but the type of cancer and other details is unknown. Also 3 maternal uncles with no known history of cancer.   Maternal cousins: other than cousin mentioned above (breast cancer at 27), no other history of cancer Carrie Murillo is aware of.  Maternal grandfather: died before pt was born, unk cause of death Maternal grandmother:died of heart disease.   Patient's maternal ancestors are of African American descent, and paternal ancestors are of African American descent. There is no reported Ashkenazi Jewish ancestry. There is no known consanguinity.  GENETIC COUNSELING ASSESSMENT: Carrie Murillo is a 56 y.o. female with a family history of a Hereditary Cancer Predisposition Syndrome. We, therefore, discussed and recommended the following at today's visit.   DISCUSSION: We reviewed the characteristics, features and inheritance patterns of hereditary cancer syndromes. We also discussed genetic testing, including the appropriate family members to test, the process of testing, insurance coverage and turn-around-time for results. We discussed the implications of a negative, positive and/or variant of uncertain significant result. We recommended Carrie Murillo pursue genetic testing for the Multi-cancer gene panel.   The Multi-Cancer Panel offered by [facility] includes sequencing and/or deletion duplication testing of the following 91 genes: AIP, ALK, APC, ATM, AXIN2, BAP1, BARD1, BLM, BMPR1A, BRCA1, BRCA2, BRIP1, BUB1B, CASR, CDC73, CDH1, CDK4, CDKN1B, CDKN1C, CDKN2A, CEBPA, CEP57, CHEK2, CTNNA1, DICER1, DIS3L2, EGFR, ENG, EPCAM, FH, FLCN, GALNT12, GATA2, GPC3, GREM1,  HOXB13, HRAS, KIT, MAX, MEN1, MET, MITF, MLH1, MLH3, MSH2, MSH3, MSH6, MUTYH, NBN, NF1, NF2, NTHL1, PALB2, PDGFRA, PHOX2B, PMS2, POLD1, POLE, POT1, PRKAR1A, PTCH1, PTEN, RAD50, RAD51C, RAD51D, RB1, RECQL4, RET, RNF43, RPS20, RUNX1, SDHA, SDHAF2, SDHB, SDHC, SDHD, SMAD4, SMARCA4, SMARCB1, SMARCE1, STK11, SUFU, TERC, TERT, TMEM127, TP53, TSC1, TSC2, VHL, WRN, WT1  We discussed that only 5-10% of cancers are associated with a Hereditary cancer predisposition syndrome.  One of the most common hereditary cancer syndromes that increases breast cancer risk is called Hereditary Breast and Ovarian Cancer (HBOC) syndrome.  This syndrome is caused by mutations in the BRCA1 and  BRCA2 genes.  This syndrome increases an individual's lifetime risk to develop breast, ovarian, pancreatic, and other types of cancer.  There are also many other cancer predisposition syndromes caused by mutations in several other genes.  At this time we do not know what mutation was identified in her maternal cousin 1x removed, and Carrie Murillo does not know anything else about the gene mutation other than it caused cancer risk (doesn't know if it was a breast gene, colon gene, etc)- therefore we discussed a larger cancer panel may be appropriative to ensure we have analyzed whatever gene the familial mutation is in.  If she gets a copy of her cousin 1x removed's report we could speak more specifically to the mutation in the family. She does not have access to this report at this time, but could try to ask family members. As far as she is aware, no other relatives have done genetic testing at this point in time.   We discussed that if she is found to have a mutation in one of these genes, it may impact future medical management recommendations such as increased cancer screenings and consideration of risk reducing surgeries.  A positive result could also have implications for the patient's family members.  A Negative result would mean we did not  find a hereditary predisposition to cancer in her and she likely doe snot have the familial mutation (although we need a family test report to confirm this), but does not rule out the possibility of a hereditary risk for cancer.  There could be mutations that are undetectable by current technology, or in genes not yet tested or identified to increase cancer risk.    We discussed the potential to find a Variant of Uncertain Significance or VUS.  These are variants that have not yet been identified as pathogenic or benign, and it is unknown if this variant is associated with increased cancer risk or if this is a normal finding.  Most VUS's are reclassified to benign or likely benign.   It should not be used to make medical management decisions. With time, we suspect the lab will determine the significance of any VUS's identified if any.   Based on Carrie Murillo's family history of cancer, she meets medical criteria for genetic testing. Despite that she meets criteria, she may still have an out of pocket cost. The laboratory can provide her with an estimate of her OOP cost.  she was given the contact information for the laboratory if she has further questions. .   Based on the patient's personal and family history, the statistical model (Tyrer Cusik)   Was used to estimate her risk of developing breast cancer. This estimates her lifetime risk of developing breast cancer to be approximately 17.8%. This estimation is performed in the setting negative genetic test results.  A positive result may significantly impact this risk assessment.  The patient's lifetime breast cancer risk is a preliminary estimate based on available information using one of several models endorsed by the [place] (ACS). The ACS recommends consideration of breast MRI screening as an adjunct to mammography for patients at high risk (defined as 20% or greater lifetime risk). A more detailed breast cancer risk assessment can be  considered, if clinically indicated.    We discussed that some people do not want to undergo genetic testing due to fear of genetic discrimination.  A federal law called the Genetic Information Non-Discrimination Act (GINA) of 2008 helps protect individuals against genetic discrimination  based on their genetic test results.  It impacts both health insurance and employment.  For health insurance, it protects against increased premiums, being kicked off insurance or being forced to take a test in order to be insured.  For employment it protects against hiring, firing and promoting decisions based on genetic test results.  Health status due to a cancer diagnosis is not protected under GINA.  This law does not protect life insurance, disability insurance, or other types of insurance.     PLAN: After considering the risks, benefits, and limitations, Carrie Murillo  provided informed consent to pursue genetic testing and the blood Murillo was sent to [facility] for analysis of the Multi-Cancer Panel. Results should be available within approximately 2-3 weeks' time, at which point they will be disclosed by telephone to Carrie Murillo, as will any additional recommendations warranted by these results. Carrie Murillo will receive a summary of her genetic counseling visit and a copy of her results once available. This information will also be available in Epic. We encouraged Carrie Murillo to remain in contact with cancer genetics annually so that we can continuously update the family history and inform her of any changes in cancer genetics and testing that may be of benefit for her family. Carrie Murillo questions were answered to her satisfaction today. Our contact information was provided should additional questions or concerns arise.  Based on Carrie Murillo's family history, we recommended her siblings/maternal relatives, also have genetic counseling and testing. Ms. Gunkel will let us know if we can be of any assistance in  coordinating genetic counseling and/or testing for this family member.   Lastly, we encouraged Ms. Davisson to remain in contact with cancer genetics annually so that we can continuously update the family history and inform her of any changes in cancer genetics and testing that may be of benefit for this family.   Ms.  Dubach questions were answered to her satisfaction today. Our contact information was provided should additional questions or concerns arise. Thank you for the referral and allowing [place] to share in the care of your patient.   Tana Felts, MS, [facility] Certified Genetic Counselor [email or web address] phone: [phone]  The patient was seen for a total of 40 minutes in face-to-face genetic counseling.  This patient was discussed with Drs. Magrinat, Lindi Adie and/or Burr Medico who agrees with the above.

## 2018-05-11 ENCOUNTER — Telehealth: Payer: Self-pay | Admitting: Genetics

## 2018-05-11 ENCOUNTER — Ambulatory Visit: Payer: Self-pay | Admitting: Genetics

## 2018-05-11 ENCOUNTER — Encounter: Payer: Self-pay | Admitting: Genetics

## 2018-05-11 DIAGNOSIS — Z1379 Encounter for other screening for genetic and chromosomal anomalies: Secondary | ICD-10-CM

## 2018-05-11 DIAGNOSIS — Z803 Family history of malignant neoplasm of breast: Secondary | ICD-10-CM

## 2018-05-11 DIAGNOSIS — Z8481 Family history of carrier of genetic disease: Secondary | ICD-10-CM

## 2018-05-11 DIAGNOSIS — Z800 Family history of malignant neoplasm of digestive organs: Secondary | ICD-10-CM

## 2018-05-11 NOTE — Progress Notes (Signed)
HPI:  Ms. Laprise was previously seen in the [facility] clinic on 05/04/2018 due to a family history of cancer/ genetic mutaiton and concerns regarding a hereditary predisposition to cancer. Please refer to our prior cancer genetics clinic note for more information regarding Ms. Liebert's medical, social and family histories, and our assessment and recommendations, at the time. Ms. Morganti recent genetic test results were disclosed to her, as well as recommendations warranted by these results. These results and recommendations are discussed in more detail below.  CANCER HISTORY:   No history exists.     FAMILY HISTORY:  We obtained a detailed, 4-generation family history.  Significant diagnoses are listed below: Family History  Problem Relation Age of Onset  . Breast cancer Mother 15       dx late Aug 24, 2022, died at 77  . Breast cancer Maternal Aunt 28       died at 60  . Breast cancer Cousin 33  . Colon cancer Paternal Aunt        dx over 70  . Colon cancer Paternal Uncle        dx over 52  . Other Other        genetic test positive for a mutation- patient does not know which mutation  . Cancer Maternal Aunt        5 other maternal aunts all with cancer- types unk    Ms. Vlachos has no children, she has a history of miscarriage.  Ms. Shimkus has a brother who is 14 with no history of cancer and a full sister who is in her early 86's with no children and no history of cancer.  Ms. Chamberlain also has a paternal half-sister who is 1 with no hx of cancer.   Ms. Hibbitts father: died in his 12's with no hx of cancer.  Paternal Aunts/Uncles: 2 paternal aunts (1 developed colon cancer older than 47), and 4 paternal uncles, 1 of which also developed colon cancer older than 72.  Paternal cousins: no known history of cancer Paternal grandfather: unk Paternal grandmother:unk  Ms. Talavera's mother: dx with breast cancer in her late 08-24-22, died at 36.   Maternal Aunts/Uncles: mother's  identical twin sister also dx with breast cancer in late 08/24/22, she died at 25.  This aunt's daughter (patient's cousin) had breast cancer at 81, and granddaughter (patient's cousin 1x removed) had genetic testing that was positive for a 'cancer mutation'.  The report is unavailable for review- Ms. Louthan says she caould try to get a copy, but currently does not have the report.    7 other maternal aunts, 1 had breast cancer in her 33's, 5 others had cancer, but the type of cancer and other details is unknown. Also 3 maternal uncles with no known history of cancer.   Maternal cousins: other than cousin mentioned above (breast cancer at 68), no other history of cancer Ms. Tout is aware of.  Maternal grandfather: died before pt was born, unk cause of death Maternal grandmother:died of heart disease.   Patient's maternal ancestors are of African American descent, and paternal ancestors are of African American descent. There is no reported Ashkenazi Jewish ancestry. There is no known consanguinity.  GENETIC TEST RESULTS: Genetic testing performed through [facility]'s Multi-Cancer Panel reported out on 05/11/2018 showed no pathogenic mutations. The Multi-Cancer Panel offered by [facility] includes sequencing and/or deletion duplication testing of the following 91 genes: AIP, ALK, APC, ATM, AXIN2, BAP1, BARD1, BLM, BMPR1A, BRCA1, BRCA2, BRIP1,  BUB1B, CASR, CDC73, CDH1, CDK4, CDKN1B, CDKN1C, CDKN2A, CEBPA, CEP57, CHEK2, CTNNA1, DICER1, DIS3L2, EGFR, ENG, EPCAM, FH, FLCN, GALNT12, GATA2, GPC3, GREM1, HOXB13, HRAS, KIT, MAX, MEN1, MET, MITF, MLH1, MLH3, MSH2, MSH3, MSH6, MUTYH, NBN, NF1, NF2, NTHL1, PALB2, PDGFRA, PHOX2B, PMS2, POLD1, POLE, POT1, PRKAR1A, PTCH1, PTEN, RAD50, RAD51C, RAD51D, RB1, RECQL4, RET, RNF43, RPS20, RUNX1, SDHA, SDHAF2, SDHB, SDHC, SDHD, SMAD4, SMARCA4, SMARCB1, SMARCE1, STK11, SUFU, TERC, TERT, TMEM127, TP53, TSC1, TSC2, VHL, WRN, WT1.  A variant of uncertain significance (VUS) in a gene  called ATM was also noted. [id number] ([id number]) A variant of uncertain significance (VUS) in a gene called SMARCA4 was also noted. [id number] ([id number]).   The test report will be scanned into EPIC and will be located under the Molecular Pathology section of the Results Review tab. A portion of the result report is included below for reference.     We discussed with Ms. Weichel that because current genetic testing is not perfect, it is possible there may be a gene mutation in one of these genes that current testing cannot detect, but that chance is small.  We also discussed, that there could be another gene that has not yet been discovered, or that we have not yet tested, that is responsible for the cancer diagnoses in the family. It is also possible there is a hereditary cause for the cancer in the family that Ms. Estey did not inherit and therefore was not identified in her testing.  Therefore, it is important to remain in touch with cancer genetics in the future so that we can continue to offer Ms. Zachar the most up to date genetic testing.   Regarding the VUS's in ATM and SMARCA4: At this time, it is unknown if these variants are associated with increased cancer risk or if they are normal findings, but most variants such as these get reclassified to being inconsequential. They should not be used to make medical management decisions. With time, we suspect the lab will determine the significance of these variants, if any. If we do learn more about them, we will try to contact Ms. Rindfleisch to discuss it further. However, it is important to stay in touch with [place] periodically and keep the address and phone number up to date.  ADDITIONAL GENETIC TESTING: We discussed with Ms. Avena that her genetic testing was fairly extensive.  If there are are genes identified to increase cancer risk that can be analyzed in the future, we would be happy to discuss and coordinate this testing at that time.     CANCER SCREENING RECOMMENDATIONS: Ms. Countess test result is considered negative (normal).  This means that we have not identified a hereditary predisposition to cancer in her at this time.   While reassuring, this does not definitively rule out a hereditary predisposition to cancer. It is still possible that there could be genetic mutations that are undetectable by current technology, or genetic mutations in genes that have not been tested or identified to increase cancer risk.  Therefore, it is recommended she continue to follow the cancer management and screening guidelines provided by her oncology and primary healthcare provider. An individual's cancer risk is not determined by genetic test results alone.  Overall cancer risk assessment includes additional factors such as personal medical history, family history, etc.  These should be used to make a personalized plan for cancer prevention and surveillance.    Based on the patient's personal and family history, the statistical model Building surveyor Cusik)   Was used  to estimate her risk of developing breast cancer. This estimates her lifetime risk of developing breast cancer to be approximately 17.8%.  The patient's lifetime breast cancer risk is a preliminary estimate based on available information using one of several models endorsed by the [place] (ACS). The ACS recommends consideration of breast MRI screening as an adjunct to mammography for patients at high risk (defined as 20% or greater lifetime risk). A more detailed breast cancer risk assessment can be considered, if clinically indicated.        RECOMMENDATIONS FOR FAMILY MEMBERS:  Relatives in this family might be at some increased risk of developing cancer, over the general population risk, simply due to the family history of cancer.  We recommended women in this family have a yearly mammogram beginning at age 76, or 27 years younger than the earliest onset of cancer, an  annual clinical breast exam, and perform monthly breast self-exams. Women in this family should also have a gynecological exam as recommended by their primary provider. All family members should have a colonoscopy by age 4 (or as directed by their doctors).  All family members should inform their physicians about the family history of cancer so their doctors can make the most appropriate screening recommendations for them.   Based on the reported history of a maternal cousin 1x removed with a genetic mutation, we recommend all maternally related relatives also have genetic testing.  Ms. Lemberger states she will still try to send a copy of her cousin's test report if she is able to obtain it.   FOLLOW-UP: Lastly, we discussed with Ms. Botts that cancer genetics is a rapidly advancing field and it is possible that new genetic tests will be appropriate for her and/or her family members in the future. We encouraged her to remain in contact with cancer genetics on an annual basis so we can update her personal and family histories and let her know of advances in cancer genetics that may benefit this family.   Our contact number was provided. Ms. Apple questions were answered to her satisfaction, and she knows she is welcome to call us at anytime with additional questions or concerns.   Ferol Luz, MS, [facility] Certified Genetic Counselor [email or web address]

## 2018-05-11 NOTE — Telephone Encounter (Signed)
Revealed negative genetic testing.  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   This normal result is reassuring and indicates that she does not have a hereditary predisposition to cancer. .  However, genetic testing is not perfect, and cannot definitively rule out a hereditary cause.  It will be important for her to keep in contact with genetics to learn if any additional testing may be needed in the future.     We never did get a copy of her cousin 1x removed's results to compare. We discussed her family history does still increase her risk and it is important she continue her current screening (mammograms, self breast exams, colonocsopy, etc).   We recommend her siblings/maternal relatives also have genetic testing as well.   

## 2018-08-07 ENCOUNTER — Ambulatory Visit
Admission: RE | Admit: 2018-08-07 | Discharge: 2018-08-07 | Disposition: A | Discharge status: Home / Self Care | Payer: BLUE CROSS/BLUE SHIELD | Source: Ambulatory Visit | Attending: Family Medicine | Admitting: Family Medicine

## 2018-08-07 DIAGNOSIS — Z1231 Encounter for screening mammogram for malignant neoplasm of breast: Secondary | ICD-10-CM

## 2018-10-26 ENCOUNTER — Other Ambulatory Visit: Payer: Self-pay | Admitting: Family Medicine

## 2018-10-26 ENCOUNTER — Other Ambulatory Visit: Payer: Self-pay

## 2018-10-26 ENCOUNTER — Ambulatory Visit
Admission: RE | Admit: 2018-10-26 | Discharge: 2018-10-26 | Disposition: A | Discharge status: Home / Self Care | Payer: BLUE CROSS/BLUE SHIELD | Source: Ambulatory Visit | Attending: Family Medicine | Admitting: Family Medicine

## 2018-10-26 DIAGNOSIS — M130 Polyarthritis, unspecified: Secondary | ICD-10-CM

## 2019-04-03 ENCOUNTER — Other Ambulatory Visit: Payer: Self-pay | Admitting: Obstetrics and Gynecology

## 2019-04-04 ENCOUNTER — Telehealth: Payer: Self-pay | Admitting: *Deleted

## 2019-04-04 NOTE — Telephone Encounter (Signed)
Called and spoke with the patient. Scheduled appt for 10/19. Gave instructions on parking and no visitors

## 2019-04-16 ENCOUNTER — Inpatient Hospital Stay (HOSPITAL_BASED_OUTPATIENT_CLINIC_OR_DEPARTMENT_OTHER): Payer: BC Managed Care – PPO | Admitting: Gynecologic Oncology

## 2019-04-16 ENCOUNTER — Inpatient Hospital Stay: Payer: BC Managed Care – PPO | Attending: Gynecologic Oncology

## 2019-04-16 ENCOUNTER — Encounter: Payer: Self-pay | Admitting: Gynecologic Oncology

## 2019-04-16 ENCOUNTER — Other Ambulatory Visit: Payer: Self-pay | Admitting: Gynecologic Oncology

## 2019-04-16 ENCOUNTER — Other Ambulatory Visit: Payer: Self-pay

## 2019-04-16 VITALS — BP 134/80 | HR 72 | Temp 98.0°F | Resp 16 | Ht 63.0 in | Wt 165.5 lb

## 2019-04-16 DIAGNOSIS — D259 Leiomyoma of uterus, unspecified: Secondary | ICD-10-CM | POA: Insufficient documentation

## 2019-04-16 DIAGNOSIS — E663 Overweight: Secondary | ICD-10-CM | POA: Insufficient documentation

## 2019-04-16 DIAGNOSIS — N950 Postmenopausal bleeding: Secondary | ICD-10-CM | POA: Insufficient documentation

## 2019-04-16 DIAGNOSIS — Z79899 Other long term (current) drug therapy: Secondary | ICD-10-CM | POA: Insufficient documentation

## 2019-04-16 DIAGNOSIS — R971 Elevated cancer antigen 125 [CA 125]: Secondary | ICD-10-CM | POA: Diagnosis not present

## 2019-04-16 DIAGNOSIS — I10 Essential (primary) hypertension: Secondary | ICD-10-CM

## 2019-04-16 DIAGNOSIS — Z803 Family history of malignant neoplasm of breast: Secondary | ICD-10-CM | POA: Insufficient documentation

## 2019-04-16 DIAGNOSIS — D3911 Neoplasm of uncertain behavior of right ovary: Secondary | ICD-10-CM | POA: Diagnosis not present

## 2019-04-16 DIAGNOSIS — D252 Subserosal leiomyoma of uterus: Secondary | ICD-10-CM | POA: Insufficient documentation

## 2019-04-16 DIAGNOSIS — N838 Other noninflammatory disorders of ovary, fallopian tube and broad ligament: Secondary | ICD-10-CM

## 2019-04-16 DIAGNOSIS — D251 Intramural leiomyoma of uterus: Secondary | ICD-10-CM

## 2019-04-16 DIAGNOSIS — Z6829 Body mass index (BMI) 29.0-29.9, adult: Secondary | ICD-10-CM | POA: Diagnosis not present

## 2019-04-16 DIAGNOSIS — D250 Submucous leiomyoma of uterus: Secondary | ICD-10-CM

## 2019-04-16 LAB — BASIC METABOLIC PANEL
Anion gap: 13 (ref 5–15)
BUN: 23 mg/dL — ABNORMAL HIGH (ref 6–20)
CO2: 23 mmol/L (ref 22–32)
Calcium: 9 mg/dL (ref 8.9–10.3)
Chloride: 106 mmol/L (ref 98–111)
Creatinine, Ser: 0.93 mg/dL (ref 0.44–1.00)
GFR calc Af Amer: >60 mL/min (ref >60)
GFR calc non Af Amer: >60 mL/min (ref >60)
Glucose, Bld: 102 mg/dL — ABNORMAL HIGH (ref 70–99)
Potassium: 3.8 mmol/L (ref 3.5–5.1)
Sodium: 142 mmol/L (ref 135–145)

## 2019-04-16 MED ORDER — SENNOSIDES-DOCUSATE SODIUM 8.6-50 MG PO TABS: 2.0000 | ORAL_TABLET | Freq: Every day | ORAL | 1 refills | Status: AC

## 2019-04-16 MED ORDER — IBUPROFEN 600 MG PO TABS: 600.0000 mg | ORAL_TABLET | Freq: Four times a day (QID) | ORAL | 0 refills | Status: AC | PRN

## 2019-04-16 MED ORDER — OXYCODONE HCL 5 MG PO TABS: 5.0000 mg | ORAL_TABLET | ORAL | 0 refills | Status: AC | PRN

## 2019-04-16 NOTE — Progress Notes (Signed)
Consult Note: Gyn-Onc  Consult was requested by Dr. Landry Mellow for the evaluation of Carrie Murillo 58 y.o. female  CC:  Chief Complaint  Patient presents with  . Ovarian mass, right    Assessment/Plan:  Carrie Murillo  is a 58 y.o.  year old with an 11cm complex right ovarian mass, fibroid uterus, and postmenopausal bleeding. She has an elevated Ca1 25.  I reviewed her ultrasound scan results and laboratory evaluations.  Overall my greatest suspicion is that she has an endometrioma, however this does not explain the symptoms of postmenopausal bleeding and her endometrial biopsy was nondiagnostic.  Therefore I am recommending a total hysterectomy BSO with frozen section evaluation (including of the endometrium and right ovary).  If malignancy is encountered we will follow with staging.  I explained to Carrie Murillo that endometriosis and history of a prior myomectomy portends a high probability of significant pelvic adhesive disease.  If this is the case her surgery has increased risk and complexity including increased risk for visceral injury.  Given her fibroid uterus and her nulliparity she may require mini laparotomy for specimen delivery.  We reviewed surgical planning and anticipated risks and postoperative recovery.  We we will first obtain a CT scan of the abdomen and pelvis to evaluate for occult extraovarian disease suggesting metastatic ovarian cancer as a cause for her elevated Ca1 25.  Surgery scheduled for next week.   HPI: Carrie Murillo is a 58 year old P0 who is seen in consultation at the request of Dr. Landry Mellow for evaluation of a right complex ovarian mass, elevated CA-125, postmenopausal bleeding, fibroid uterus.  The patient reported a remote history of a fibroid uterus for which she underwent an abdominal myomectomy via low transverse incision in 2000.  She subsequently underwent what she describes as laser surgery through her groin in 2006 with Dr. Garwin Brothers at [facility].  There is no operative note from this procedure available in epic.  It is unclear if she meant to hysteroscopic procedure or uterine artery embolization.  She reports that she became amenorrheic after this procedure and therefore it may have also included endometrial ablation.  She developed hot flashes a few years after this procedure but had been amenorrheic immediately after that procedure.  Beginning in January 2020 she began having intermittent vaginal spotting.  She was seen and evaluated by Dr. Landry Mellow who performed a transvaginal ultrasound scan on April 03, 2019.  This revealed a fibroid uterus measuring 11.7 x 8.7 x 6.5 cm.  The endometrium could not be clearly discerned due to shadowing from the calcified fibroids.  The left ovary was normal in appearance and measured 1.3 x 2.2 x 1.5 cm.  The right ovary contained a complex mass with low-level internal echoes and measured 11.5 x 7.6 x 6.8 cm.  No blood flow was noted.  A Ca1 25 was drawn on April 04, 2019 and was elevated at 249.  Due to her postmenopausal bleeding symptoms she underwent an office endometrial biopsy and resection of a cervical polyp on April 03, 2019.  Both were benign with the endometrial biopsy revealing necrotic tissue in the cervical polyp being a benign cervical polyp.  A Pap smear had been performed on March 27, 2018 and had normal cytology with no high risk HPV detected.  The patient has a strong family history for breast cancer and had genetic testing performed in November 2019.  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   Her medical history is significant for hypertension and overweight (BMI 29 kg per metered square).  Surgical history significant for an abdominal myomectomy, colonoscopy, hemorrhoidectomy, and cataract surgery.    Family history is significant for a mother with a history of  breast cancer maternal aunt with history of breast cancer.  The patient works at [employer] and lives with her husband.  She has never been pregnant and denies a history of abnormal Pap test.  Her most significant history is of symptomatic uterine fibroids.  Current Meds:  Outpatient Encounter Medications as of 04/16/2019  Medication Sig  . amLODIPine-Valsartan-HCTZ 10-160-12.5 MG TABS Take by mouth.  . valsartan-hydrochlorothiazide (DIOVAN-HCT) 160-12.5 MG tablet Take 1 tablet by mouth daily.  Marland Kitchen amoxicillin-clavulanate (AUGMENTIN) 875-125 MG tablet Take 1 tablet by mouth every 12 (twelve) hours.  . bimatoprost (LUMIGAN) 0.03 % ophthalmic solution Place 1 drop into both eyes at bedtime.  . cetirizine (ZYRTEC) 10 MG tablet Take 1 tablet (10 mg total) by mouth daily.   No facility-administered encounter medications on file as of 04/16/2019.     Allergy: No Known Allergies  Social Hx:   Social History   Socioeconomic History  . Marital status: Single    Spouse name: Not on file  . Number of children: Not on file  . Years of education: Not on file  . Highest education level: Not on file  Occupational History  . Not on file  Social Needs  . Financial resource strain: Not on file  . Food insecurity    Worry: Not on file    Inability: Not on file  . Transportation needs    Medical: Not on file    Non-medical: Not on file  Tobacco Use  . Smoking status: Never Smoker  . Smokeless tobacco: Never Used  Substance and Sexual Activity  . Alcohol use: No  . Drug use: No  . Sexual activity: Not on file  Lifestyle  . Physical activity    Days per week: Not on file    Minutes per session: Not on file  . Stress: Not on file  Relationships  . Social Herbalist on phone: Not on file    Gets together: Not on file    Attends religious service: Not on file    Active member of club or organization: Not on file    Attends meetings of clubs or organizations: Not on file     Relationship status: Not on file  . Intimate partner violence    Fear of current or ex partner: Not on file    Emotionally abused: Not on file    Physically abused: Not on file    Forced sexual activity: Not on file  Other Topics Concern  . Not on file  Social History Narrative  . Not on file    Past Surgical Hx: History reviewed. No pertinent surgical history.  Past Medical Hx:  Past Medical History:  Diagnosis Date  . Family history of breast cancer   . Family history of colon cancer   . Family history of genetic disease carrier   . Hypertension     Past Gynecological History:  See HPI No LMP recorded. Patient is postmenopausal.  Family Hx:  Family History  Problem Relation Age of Onset  . Breast cancer Mother 50       dx late 08-29-2022, died at 92  . Breast cancer Maternal Aunt 28  died at 24  . Breast cancer Cousin 61  . Colon cancer Paternal Aunt        dx over 62  . Colon cancer Paternal Uncle        dx over 28  . Other Other        genetic test positive for a mutation- patient does not know which mutation  . Cancer Maternal Aunt        5 other maternal aunts all with cancer- types unk    Review of Systems:  Constitutional  Feels well,    ENT Normal appearing ears and nares bilaterally Skin/Breast  No rash, sores, jaundice, itching, dryness Cardiovascular  No chest pain, shortness of breath, or edema  Pulmonary  No cough or wheeze.  Gastro Intestinal  No nausea, vomitting, or diarrhoea. No bright red blood per rectum, no abdominal pain, change in bowel movement, or constipation.  Genito Urinary  No frequency, urgency, dysuria, + postmenopausal bleeding Musculo Skeletal  No myalgia, arthralgia, joint swelling or pain  Neurologic  No weakness, numbness, change in gait,  Psychology  No depression, anxiety, insomnia.   Vitals:  Blood pressure 134/80, pulse 72, temperature 98 F (36.7 C), temperature source Temporal, resp. rate 16, height 5' 3"  (1.6  m), weight 165 lb 8 oz (75.1 kg), SpO2 100 %.  Physical Exam: WD in NAD Neck  Supple NROM, without any enlargements.  Lymph Node Survey No cervical supraclavicular or inguinal adenopathy Cardiovascular  Pulse normal rate, regularity and rhythm. S1 and S2 normal.  Lungs  Clear to auscultation bilateraly, without wheezes/crackles/rhonchi. Good air movement.  Skin  No rash/lesions/breakdown  Psychiatry  Alert and oriented to person, place, and time  Abdomen  Normoactive bowel sounds, abdomen soft, non-tender and overweight without evidence of hernia.  Back No CVA tenderness Genito Urinary  Vulva/vagina: Normal external female genitalia.   No lesions. No discharge or bleeding.  Bladder/urethra:  No lesions or masses, well supported bladder  Vagina: normal  Cervix: Normal appearing, no lesions.  Uterus: Slightly bulky, very mobile, no parametrial involvement or nodularity.  Adnexa: no palpable masses. Rectal  No RV septal nodularity or adhesions to posterior uterus appreciated.  Extremities  No bilateral cyanosis, clubbing or edema.   Carrie Solo, MD  04/16/2019, 12:03 PM

## 2019-04-16 NOTE — Patient Instructions (Signed)
Preparing for your Surgery  Plan for surgery on April 24, 2019 with Dr. Everitt Amber at [place] will be scheduled for a robotic assisted total laparoscopic hysterectomy greater than 250 grams, bilateral salpingo-oophorectomy, possible staging, possible mini-laparotomy.   Pre-operative Testing -You will receive a phone call from presurgical testing at [facility] if you have not received a call already to arrange for a pre-operative testing appointment before your surgery.  This appointment normally occurs one to two weeks before your scheduled surgery.   -Bring your insurance card, copy of an advanced directive if applicable, medication list  -At that visit, you will be asked to sign a consent for a possible blood transfusion in case a transfusion becomes necessary during surgery.  The need for a blood transfusion is rare but having consent is a necessary part of your care.     -You should not be taking blood thinners or aspirin at least ten days prior to surgery unless instructed by your surgeon.  -Do not take supplements such as fish oil (omega 3), red yeast rice, tumeric before your surgery.  Day Before Surgery at [place] will be asked to take in a light diet the day before surgery.  Avoid carbonated beverages.  You will be advised to have nothing to eat or drink after midnight the evening before.    Eat a light diet the day before surgery.  Examples including soups, broths, toast, yogurt, mashed potatoes.  Things to avoid include carbonated beverages (fizzy beverages), raw fruits and raw vegetables, or beans.   If your bowels are filled with gas, your surgeon will have difficulty visualizing your pelvic organs which increases your surgical risks.  Your role in recovery Your role is to become active as soon as directed by your doctor, while still giving yourself time to heal.  Rest when you feel tired. You will be asked to do the following in order to speed your  recovery:  - Cough and breathe deeply. This helps toclear and expand your lungs and can prevent pneumonia.  - Do mild physical activity. Walking or moving your legs help your circulation and body functions return to normal. A staff member will help you when you try to walk and will provide you with simple exercises. Do not try to get up or walk alone the first time. - Actively manage your pain. Managing your pain lets you move in comfort. We will ask you to rate your pain on a scale of zero to 10. It is your responsibility to tell your doctor or nurse where and how much you hurt so your pain can be treated.  Special Considerations -If you are diabetic, you may be placed on insulin after surgery to have closer control over your blood sugars to promote healing and recovery.  This does not mean that you will be discharged on insulin.  If applicable, your oral antidiabetics will be resumed when you are tolerating a solid diet.  -Your final pathology results from surgery should be available around one week after surgery and the results will be relayed to you when available.  -Dr. Lahoma Crocker is the surgeon that assists your GYN Oncologist with surgery.  If you end up staying the night, the next day after your surgery you will either see Dr. Denman George or Dr. Lahoma Crocker.  -FMLA forms can be faxed to [phone] and please allow 5-7 business days for completion.  Pain Management After Surgery -You have been prescribed your pain  medication and bowel regimen medications before surgery so that you can have these available when you are discharged from the hospital. The pain medication is for use ONLY AFTER surgery and a new prescription will not be given.   -Make sure that you have Tylenol and Ibuprofen at home to use on a regular basis after surgery for pain control. We recommend alternating the medications every hour to six hours since they work differently and are processed in the body  differently for pain relief.  -Review the attached handout on narcotic use and their risks and side effects.   Bowel Regimen -You have been prescribed Sennakot-S to take nightly to prevent constipation especially if you are taking the narcotic pain medication intermittently.  It is important to prevent constipation and drink adequate amounts of liquids.  Blood Transfusion Information WHAT IS A BLOOD TRANSFUSION? A transfusion is the replacement of blood or some of its parts. Blood is made up of multiple cells which provide different functions.  Red blood cells carry oxygen and are used for blood loss replacement.  White blood cells fight against infection.  Platelets control bleeding.  Plasma helps clot blood.  Other blood products are available for specialized needs, such as hemophilia or other clotting disorders. BEFORE THE TRANSFUSION  Who gives blood for transfusions?   You may be able to donate blood to be used at a later date on yourself (autologous donation).  Relatives can be asked to donate blood. This is generally not any safer than if you have received blood from a stranger. The same precautions are taken to ensure safety when a relative's blood is donated.  Healthy volunteers who are fully evaluated to make sure their blood is safe. This is blood bank blood. Transfusion therapy is the safest it has ever been in the practice of medicine. Before blood is taken from a donor, a complete history is taken to make sure that person has no history of diseases nor engages in risky social behavior (examples are intravenous drug use or sexual activity with multiple partners). The donor's travel history is screened to minimize risk of transmitting infections, such as malaria. The donated blood is tested for signs of infectious diseases, such as HIV and hepatitis. The blood is then tested to be sure it is compatible with you in order to minimize the chance of a transfusion reaction. If you  or a relative donates blood, this is often done in anticipation of surgery and is not appropriate for emergency situations. It takes many days to process the donated blood. RISKS AND COMPLICATIONS Although transfusion therapy is very safe and saves many lives, the main dangers of transfusion include:   Getting an infectious disease.  Developing a transfusion reaction. This is an allergic reaction to something in the blood you were given. Every precaution is taken to prevent this. The decision to have a blood transfusion has been considered carefully by your caregiver before blood is given. Blood is not given unless the benefits outweigh the risks.  AFTER SURGERY INSTRUCTIONS  04/16/2019  Return to work: 4-6 weeks if applicable  Activity: 1. Be up and out of the bed during the day.  Take a nap if needed.  You may walk up steps but be careful and use the hand rail.  Stair climbing will tire you more than you think, you may need to stop part way and rest.   2. No lifting or straining for 6 weeks.  3. No driving for 1 week(s).  Do not drive if you are taking narcotic pain medicine.  4. Shower daily.  Use soap and water on your incision and pat dry; don't rub.  No tub baths until cleared by your surgeon.   5. No sexual activity and nothing in the vagina for 8 weeks.  6. You may experience a small amount of clear drainage from your incisions, which is normal.  If the drainage persists or increases, please call the office.  7. You may experience vaginal spotting after surgery or around the 6-8 week mark from surgery when the stitches at the top of the vagina begin to dissolve.  The spotting is normal but if you experience heavy bleeding, call our office.  8. Take Tylenol or ibuprofen first for pain and only use narcotic pain medication for severe pain not relieved by the Tylenol or Ibuprofen.  Monitor your Tylenol intake to a max of 4,000 mg.  Diet: 1. Low sodium Heart Healthy Diet is  recommended.  2. It is safe to use a laxative, such as Miralax or Colace, if you have difficulty moving your bowels. You can take Sennakot at bedtime every evening to keep bowel movements regular and to prevent constipation.    Wound Care: 1. Keep clean and dry.  Shower daily.  Reasons to call the Doctor:  Fever - Oral temperature greater than 100.4 degrees Fahrenheit  Foul-smelling vaginal discharge  Difficulty urinating  Nausea and vomiting  Increased pain at the site of the incision that is unrelieved with pain medicine.  Difficulty breathing with or without chest pain  New calf pain especially if only on one side  Sudden, continuing increased vaginal bleeding with or without clots.   Contacts: For questions or concerns you should contact:  Dr. Everitt Amber at [phone]  Joylene John, NP at [phone]  After Hours: call [phone] and have the GYN Oncologist paged/contacted

## 2019-04-16 NOTE — H&P (View-Only) (Signed)
Consult Note: Gyn-Onc  Consult was requested by Dr. Landry Mellow for the evaluation of Carrie Murillo 58 y.o. female  CC:  Chief Complaint  Patient presents with  . Ovarian mass, right    Assessment/Plan:  Carrie Murillo  is a 58 y.o.  year old with an 11cm complex right ovarian mass, fibroid uterus, and postmenopausal bleeding. She has an elevated Ca1 25.  I reviewed her ultrasound scan results and laboratory evaluations.  Overall my greatest suspicion is that she has an endometrioma, however this does not explain the symptoms of postmenopausal bleeding and her endometrial biopsy was nondiagnostic.  Therefore I am recommending a total hysterectomy BSO with frozen section evaluation (including of the endometrium and right ovary).  If malignancy is encountered we will follow with staging.  I explained to Carrie Murillo that endometriosis and history of a prior myomectomy portends a high probability of significant pelvic adhesive disease.  If this is the case her surgery has increased risk and complexity including increased risk for visceral injury.  Given her fibroid uterus and her nulliparity she may require mini laparotomy for specimen delivery.  We reviewed surgical planning and anticipated risks and postoperative recovery.  We we will first obtain a CT scan of the abdomen and pelvis to evaluate for occult extraovarian disease suggesting metastatic ovarian cancer as a cause for her elevated Ca1 25.  Surgery scheduled for next week.   HPI: Ms. Carrie Murillo is a 58 year old P0 who is seen in consultation at the request of Dr. Landry Mellow for evaluation of a right complex ovarian mass, elevated CA-125, postmenopausal bleeding, fibroid uterus.  The patient reported a remote history of a fibroid uterus for which she underwent an abdominal myomectomy via low transverse incision in 2000.  She subsequently underwent what she describes as laser surgery through her groin in 2006 with Dr. Garwin Brothers at [facility].  There is no operative note from this procedure available in epic.  It is unclear if she meant to hysteroscopic procedure or uterine artery embolization.  She reports that she became amenorrheic after this procedure and therefore it may have also included endometrial ablation.  She developed hot flashes a few years after this procedure but had been amenorrheic immediately after that procedure.  Beginning in January 2020 she began having intermittent vaginal spotting.  She was seen and evaluated by Dr. Landry Mellow who performed a transvaginal ultrasound scan on April 03, 2019.  This revealed a fibroid uterus measuring 11.7 x 8.7 x 6.5 cm.  The endometrium could not be clearly discerned due to shadowing from the calcified fibroids.  The left ovary was normal in appearance and measured 1.3 x 2.2 x 1.5 cm.  The right ovary contained a complex mass with low-level internal echoes and measured 11.5 x 7.6 x 6.8 cm.  No blood flow was noted.  A Ca1 25 was drawn on April 04, 2019 and was elevated at 249.  Due to her postmenopausal bleeding symptoms she underwent an office endometrial biopsy and resection of a cervical polyp on April 03, 2019.  Both were benign with the endometrial biopsy revealing necrotic tissue in the cervical polyp being a benign cervical polyp.  A Pap smear had been performed on March 27, 2018 and had normal cytology with no high risk HPV detected.  The patient has a strong family history for breast cancer and had genetic testing performed in November 2019.  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   Her medical history is significant for hypertension and overweight (BMI 29 kg per metered square).  Surgical history significant for an abdominal myomectomy, colonoscopy, hemorrhoidectomy, and cataract surgery.    Family history is significant for a mother with a history of  breast cancer maternal aunt with history of breast cancer.  The patient works at [employer] and lives with her husband.  She has never been pregnant and denies a history of abnormal Pap test.  Her most significant history is of symptomatic uterine fibroids.  Current Meds:  Outpatient Encounter Medications as of 04/16/2019  Medication Sig  . amLODIPine-Valsartan-HCTZ 10-160-12.5 MG TABS Take by mouth.  . valsartan-hydrochlorothiazide (DIOVAN-HCT) 160-12.5 MG tablet Take 1 tablet by mouth daily.  Marland Kitchen amoxicillin-clavulanate (AUGMENTIN) 875-125 MG tablet Take 1 tablet by mouth every 12 (twelve) hours.  . bimatoprost (LUMIGAN) 0.03 % ophthalmic solution Place 1 drop into both eyes at bedtime.  . cetirizine (ZYRTEC) 10 MG tablet Take 1 tablet (10 mg total) by mouth daily.   No facility-administered encounter medications on file as of 04/16/2019.     Allergy: No Known Allergies  Social Hx:   Social History   Socioeconomic History  . Marital status: Single    Spouse name: Not on file  . Number of children: Not on file  . Years of education: Not on file  . Highest education level: Not on file  Occupational History  . Not on file  Social Needs  . Financial resource strain: Not on file  . Food insecurity    Worry: Not on file    Inability: Not on file  . Transportation needs    Medical: Not on file    Non-medical: Not on file  Tobacco Use  . Smoking status: Never Smoker  . Smokeless tobacco: Never Used  Substance and Sexual Activity  . Alcohol use: No  . Drug use: No  . Sexual activity: Not on file  Lifestyle  . Physical activity    Days per week: Not on file    Minutes per session: Not on file  . Stress: Not on file  Relationships  . Social Herbalist on phone: Not on file    Gets together: Not on file    Attends religious service: Not on file    Active member of club or organization: Not on file    Attends meetings of clubs or organizations: Not on file     Relationship status: Not on file  . Intimate partner violence    Fear of current or ex partner: Not on file    Emotionally abused: Not on file    Physically abused: Not on file    Forced sexual activity: Not on file  Other Topics Concern  . Not on file  Social History Narrative  . Not on file    Past Surgical Hx: History reviewed. No pertinent surgical history.  Past Medical Hx:  Past Medical History:  Diagnosis Date  . Family history of breast cancer   . Family history of colon cancer   . Family history of genetic disease carrier   . Hypertension     Past Gynecological History:  See HPI No LMP recorded. Patient is postmenopausal.  Family Hx:  Family History  Problem Relation Age of Onset  . Breast cancer Mother 50       dx late August 30, 2022, died at 52  . Breast cancer Maternal Aunt 28  died at 47  . Breast cancer Cousin 33  . Colon cancer Paternal Aunt        dx over 45  . Colon cancer Paternal Uncle        dx over 87  . Other Other        genetic test positive for a mutation- patient does not know which mutation  . Cancer Maternal Aunt        5 other maternal aunts all with cancer- types unk    Review of Systems:  Constitutional  Feels well,    ENT Normal appearing ears and nares bilaterally Skin/Breast  No rash, sores, jaundice, itching, dryness Cardiovascular  No chest pain, shortness of breath, or edema  Pulmonary  No cough or wheeze.  Gastro Intestinal  No nausea, vomitting, or diarrhoea. No bright red blood per rectum, no abdominal pain, change in bowel movement, or constipation.  Genito Urinary  No frequency, urgency, dysuria, + postmenopausal bleeding Musculo Skeletal  No myalgia, arthralgia, joint swelling or pain  Neurologic  No weakness, numbness, change in gait,  Psychology  No depression, anxiety, insomnia.   Vitals:  Blood pressure 134/80, pulse 72, temperature 98 F (36.7 C), temperature source Temporal, resp. rate 16, height 5' 3"  (1.6  m), weight 165 lb 8 oz (75.1 kg), SpO2 100 %.  Physical Exam: WD in NAD Neck  Supple NROM, without any enlargements.  Lymph Node Survey No cervical supraclavicular or inguinal adenopathy Cardiovascular  Pulse normal rate, regularity and rhythm. S1 and S2 normal.  Lungs  Clear to auscultation bilateraly, without wheezes/crackles/rhonchi. Good air movement.  Skin  No rash/lesions/breakdown  Psychiatry  Alert and oriented to person, place, and time  Abdomen  Normoactive bowel sounds, abdomen soft, non-tender and overweight without evidence of hernia.  Back No CVA tenderness Genito Urinary  Vulva/vagina: Normal external female genitalia.   No lesions. No discharge or bleeding.  Bladder/urethra:  No lesions or masses, well supported bladder  Vagina: normal  Cervix: Normal appearing, no lesions.  Uterus: Slightly bulky, very mobile, no parametrial involvement or nodularity.  Adnexa: no palpable masses. Rectal  No RV septal nodularity or adhesions to posterior uterus appreciated.  Extremities  No bilateral cyanosis, clubbing or edema.   Thereasa Solo, MD  04/16/2019, 12:03 PM

## 2019-04-18 NOTE — Patient Instructions (Addendum)
DUE TO COVID-19 ONLY ONE VISITOR IS ALLOWED TO COME WITH YOU AND STAY IN THE WAITING ROOM ONLY DURING PRE OP AND PROCEDURE DAY OF SURGERY. THE 1 VISITOR MAY VISIT WITH YOU AFTER SURGERY IN YOUR PRIVATE ROOM DURING VISITING HOURS ONLY!  YOU NEED TO HAVE A COVID 19 TEST ON__10/23_____ @__11 :00_____, THIS TEST MUST BE DONE BEFORE SURGERY, COME  [address], [place] , [address].  ([place]) ONCE YOUR COVID TEST IS COMPLETED, PLEASE BEGIN THE QUARANTINE INSTRUCTIONS AS OUTLINED IN YOUR HANDOUT.                Carrie Murillo   Your procedure is scheduled on: 04/24/19   Report to [facility] Main  Entrance   Report to Short stay 5:30 AM     Call this number if you have problems the morning of surgery [phone]   Full Liquid Diet   The day before surgery and avoid foods that cause gas   Strained creamy soups Tea, Coffee- with cream or mild and sugar or honey  Juices- cranberry , grape and apple  Jello  Milkshakes  Pudding , custards  Popsicles  Water Plain ice cream f, frozen yogurt, sherbet, plain yogurt  Fruit ices and popsicles with no fruit pulp  Sugar, honey and syrups Clear broths  Boost, Ensure, Resource and other liquid supplements NO CARBONATED BEVERAGES      Remember: Do not eat food or drink liquids :After Midnight.   BRUSH YOUR TEETH MORNING OF SURGERY AND RINSE YOUR MOUTH OUT, NO CHEWING GUM CANDY OR MINTS.     Take these medicines the morning of surgery with A SIP OF WATER: Amlodipine                                 You may not have any metal on your body including hair pins and              piercings             Do not wear jewelry, make-up, lotions, powders or perfumes, deodorant             Do not wear nail polish on your fingernails.  Do not shave  48 hours prior to surgery.           Do not bring valuables to the hospital. [place].  Contacts, dentures or bridgework  may not be worn into surgery.      Patients discharged the day of surgery will not be allowed to drive home.   IF YOU ARE HAVING SURGERY AND GOING HOME THE SAME DAY, YOU MUST HAVE AN ADULT TO DRIVE YOU HOME AND BE WITH YOU FOR 24 HOURS  . YOU MAY GO HOME BY TAXI OR UBER OR ORTHERWISE, BUT AN ADULT MUST ACCOMPANY YOU HOME AND STAY WITH YOU FOR 24 HOURS.  Name and phone number of your driver:  Special Instructions: N/A              Please read over the following fact sheets you were given: _____________________________________________________________________             [facility] - Preparing for Surgery  Before surgery, you can play an important role.   Because skin is not sterile, your skin needs to be as  free of germs as possible.   You can reduce the number of germs on your skin by washing with CHG (chlorahexidine gluconate) soap before surgery.   CHG is an antiseptic cleaner which kills germs and bonds with the skin to continue killing germs even after washing. Please DO NOT use if you have an allergy to CHG or antibacterial soaps .  If your skin becomes reddened/irritated stop using the CHG and inform your nurse when you arrive at Short Stay. Do not shave (including legs and underarms) for at least 48 hours prior to the first CHG shower. Please follow these instructions carefully:   1.  Shower with CHG Soap the night before surgery and the  morning of Surgery.  2.  If you choose to wash your hair, wash your hair first as usual with your  normal  shampoo.  3.  After you shampoo, rinse your hair and body thoroughly to remove the  shampoo.                                        4.  Use CHG as you would any other liquid soap.  You can apply chg directly  to the skin and wash                       Gently with a scrungie or clean washcloth.  5.  Apply the CHG Soap to your body ONLY FROM THE NECK DOWN.   Do not use on face/ open                           Wound or open sores. Avoid  contact with eyes, ears mouth and genitals (private parts).                       Wash face,  Genitals (private parts) with your normal soap.             6.  Wash thoroughly, paying special attention to the area where your surgery  will be performed.  7.  Thoroughly rinse your body with warm water from the neck down.  8.  DO NOT shower/wash with your normal soap after using and rinsing off  the CHG Soap.             9.  Pat yourself dry with a clean towel.            10.  Wear clean pajamas.            11.  Place clean sheets on your bed the night of your first shower and do not  sleep with pets. Day of Surgery : Do not apply any lotions/deodorants the morning of surgery.  Please wear clean clothes to the hospital/surgery center.  FAILURE TO FOLLOW THESE INSTRUCTIONS MAY RESULT IN THE CANCELLATION OF YOUR SURGERY PATIENT SIGNATURE_________________________________  NURSE SIGNATURE__________________________________  ________________________________________________________________________

## 2019-04-19 ENCOUNTER — Encounter (HOSPITAL_COMMUNITY): Payer: Self-pay

## 2019-04-19 ENCOUNTER — Ambulatory Visit (HOSPITAL_COMMUNITY)
Admission: RE | Admit: 2019-04-19 | Discharge: 2019-04-19 | Disposition: A | Discharge status: Home / Self Care | Payer: BC Managed Care – PPO | Source: Ambulatory Visit | Attending: Gynecologic Oncology | Admitting: Gynecologic Oncology

## 2019-04-19 ENCOUNTER — Other Ambulatory Visit: Payer: Self-pay

## 2019-04-19 ENCOUNTER — Encounter (HOSPITAL_COMMUNITY)
Admission: RE | Admit: 2019-04-19 | Discharge: 2019-04-19 | Disposition: A | Discharge status: Home / Self Care | Payer: BC Managed Care – PPO | Source: Ambulatory Visit | Attending: Gynecologic Oncology | Admitting: Gynecologic Oncology

## 2019-04-19 DIAGNOSIS — N838 Other noninflammatory disorders of ovary, fallopian tube and broad ligament: Secondary | ICD-10-CM | POA: Insufficient documentation

## 2019-04-19 DIAGNOSIS — D250 Submucous leiomyoma of uterus: Secondary | ICD-10-CM | POA: Diagnosis present

## 2019-04-19 DIAGNOSIS — R971 Elevated cancer antigen 125 [CA 125]: Secondary | ICD-10-CM | POA: Diagnosis not present

## 2019-04-19 DIAGNOSIS — D251 Intramural leiomyoma of uterus: Secondary | ICD-10-CM | POA: Insufficient documentation

## 2019-04-19 DIAGNOSIS — D252 Subserosal leiomyoma of uterus: Secondary | ICD-10-CM | POA: Diagnosis present

## 2019-04-19 HISTORY — DX: Cardiac arrhythmia, unspecified: I49.9

## 2019-04-19 LAB — CBC
HCT: 42.8 % (ref 36.0–46.0)
Hemoglobin: 14.3 g/dL (ref 12.0–15.0)
MCH: 28.9 pg (ref 26.0–34.0)
MCHC: 33.4 g/dL (ref 30.0–36.0)
MCV: 86.6 fL (ref 80.0–100.0)
Platelets: 254 10*3/uL (ref 150–400)
RBC: 4.94 MIL/uL (ref 3.87–5.11)
RDW: 12.8 % (ref 11.5–15.5)
WBC: 5 10*3/uL (ref 4.0–10.5)
nRBC: 0 % (ref 0.0–0.2)

## 2019-04-19 LAB — COMPREHENSIVE METABOLIC PANEL
ALT: 20 U/L (ref 0–44)
AST: 17 U/L (ref 15–41)
Albumin: 3.9 g/dL (ref 3.5–5.0)
Alkaline Phosphatase: 70 U/L (ref 38–126)
Anion gap: 8 (ref 5–15)
BUN: 23 mg/dL — ABNORMAL HIGH (ref 6–20)
CO2: 24 mmol/L (ref 22–32)
Calcium: 8.7 mg/dL — ABNORMAL LOW (ref 8.9–10.3)
Chloride: 106 mmol/L (ref 98–111)
Creatinine, Ser: 0.84 mg/dL (ref 0.44–1.00)
GFR calc Af Amer: >60 mL/min (ref >60)
GFR calc non Af Amer: >60 mL/min (ref >60)
Glucose, Bld: 99 mg/dL (ref 70–99)
Potassium: 3.6 mmol/L (ref 3.5–5.1)
Sodium: 138 mmol/L (ref 135–145)
Total Bilirubin: 0.6 mg/dL (ref 0.3–1.2)
Total Protein: 7.1 g/dL (ref 6.5–8.1)

## 2019-04-19 LAB — URINALYSIS, ROUTINE W REFLEX MICROSCOPIC
Bilirubin Urine: NEGATIVE
Glucose, UA: NEGATIVE mg/dL
Hgb urine dipstick: NEGATIVE
Ketones, ur: NEGATIVE mg/dL
Leukocytes,Ua: NEGATIVE
Nitrite: NEGATIVE
Protein, ur: NEGATIVE mg/dL
Specific Gravity, Urine: 1.017 (ref 1.005–1.030)
pH: 6 (ref 5.0–8.0)

## 2019-04-19 LAB — ABO/RH: ABO/RH(D): AB POS

## 2019-04-19 MED ORDER — SODIUM CHLORIDE (PF) 0.9 % IJ SOLN
INTRAMUSCULAR | Status: AC
  Filled 2019-04-19: qty 50

## 2019-04-19 MED ORDER — IOHEXOL 300 MG/ML  SOLN
100.0000 mL | Freq: Once | INTRAMUSCULAR | Status: AC | PRN
  Administered 2019-04-19: 100 mL via INTRAVENOUS

## 2019-04-19 NOTE — Progress Notes (Signed)
PCP - Dr. Clayburn Pert Cardiologist - none  Chest x-ray - no EKG -04/19/19  Stress Test - no ECHO - no Cardiac Cath - no  Sleep Study - no CPAP -   Fasting Blood Sugar - NA Checks Blood Sugar _____ times a day  Blood Thinner Instructions:NA Aspirin Instructions: Last Dose:  Anesthesia review:   Patient denies shortness of breath, fever, cough and chest pain at PAT appointment yes  Patient verbalized understanding of instructions that were given to them at the PAT appointment. Patient was also instructed that they will need to review over the PAT instructions again at home before surgery. yes

## 2019-04-20 ENCOUNTER — Other Ambulatory Visit (HOSPITAL_COMMUNITY)
Admission: RE | Admit: 2019-04-20 | Discharge: 2019-04-20 | Disposition: A | Discharge status: Home / Self Care | Payer: BC Managed Care – PPO | Source: Ambulatory Visit | Attending: Gynecologic Oncology | Admitting: Gynecologic Oncology

## 2019-04-20 ENCOUNTER — Telehealth: Payer: Self-pay

## 2019-04-20 DIAGNOSIS — Z01812 Encounter for preprocedural laboratory examination: Secondary | ICD-10-CM | POA: Insufficient documentation

## 2019-04-20 DIAGNOSIS — Z20828 Contact with and (suspected) exposure to other viral communicable diseases: Secondary | ICD-10-CM | POA: Insufficient documentation

## 2019-04-20 NOTE — Telephone Encounter (Signed)
I spoke with Carrie Murillo this am.  I told her the Ct san showed an ovarian mass but no enlarged lymph nodes. She verbalized understanding.

## 2019-04-21 LAB — NOVEL CORONAVIRUS, NAA (HOSP ORDER, SEND-OUT TO REF LAB; TAT 18-24 HRS): SARS-CoV-2, NAA: NOT DETECTED

## 2019-04-23 ENCOUNTER — Telehealth: Payer: Self-pay

## 2019-04-23 NOTE — Telephone Encounter (Signed)
LM for Ms Primerano to call back to the office at [phone] if she had any questions regarding her pre op instructions for her procedure tomorrow.

## 2019-04-23 NOTE — Anesthesia Preprocedure Evaluation (Addendum)
Anesthesia Evaluation  Patient identified by MRN, date of birth, ID band Patient awake    Reviewed: Allergy & Precautions, NPO status , Patient's Chart, lab work & pertinent test results  Airway Mallampati: II  TM Distance: >3 FB Neck ROM: Full    Dental  (+) Teeth Intact, Dental Advisory Given,    Pulmonary neg pulmonary ROS,    Pulmonary exam normal breath sounds clear to auscultation       Cardiovascular hypertension, Pt. on medications Normal cardiovascular exam Rhythm:Regular Rate:Normal     Neuro/Psych negative neurological ROS  negative psych ROS   GI/Hepatic negative GI ROS, Neg liver ROS,   Endo/Other  negative endocrine ROS  Renal/GU negative Renal ROS   Right ovarian mass, fibroid uterus    Musculoskeletal negative musculoskeletal ROS (+)   Abdominal Normal abdominal exam  (+)   Peds negative pediatric ROS (+)  Hematology negative hematology ROS (+)   Anesthesia Other Findings   Reproductive/Obstetrics negative OB ROS                            Anesthesia Physical Anesthesia Plan  ASA: II  Anesthesia Plan: General   Post-op Pain Management:    Induction: Intravenous  PONV Risk Score and Plan: 4 or greater and Ondansetron, Dexamethasone, Midazolam, Scopolamine patch - Pre-op and Treatment may vary due to age or medical condition  Airway Management Planned: Oral ETT  Additional Equipment: None  Intra-op Plan:   Post-operative Plan: Extubation in OR  Informed Consent: I have reviewed the patients History and Physical, chart, labs and discussed the procedure including the risks, benefits and alternatives for the proposed anesthesia with the patient or authorized representative who has indicated his/her understanding and acceptance.     Dental advisory given  Plan Discussed with: CRNA  Anesthesia Plan Comments:         Anesthesia Quick Evaluation

## 2019-04-24 ENCOUNTER — Ambulatory Visit (HOSPITAL_COMMUNITY): Payer: BC Managed Care – PPO | Admitting: Anesthesiology

## 2019-04-24 ENCOUNTER — Encounter (HOSPITAL_COMMUNITY): Payer: Self-pay

## 2019-04-24 ENCOUNTER — Ambulatory Visit (HOSPITAL_COMMUNITY)
Admission: RE | Admit: 2019-04-24 | Discharge: 2019-04-24 | Disposition: A | Discharge status: Home / Self Care | Payer: BC Managed Care – PPO | Source: Ambulatory Visit | Attending: Gynecologic Oncology | Admitting: Gynecologic Oncology

## 2019-04-24 ENCOUNTER — Encounter (HOSPITAL_COMMUNITY)
Admission: RE | Disposition: A | Discharge status: Home / Self Care | Payer: Self-pay | Source: Ambulatory Visit | Attending: Gynecologic Oncology

## 2019-04-24 DIAGNOSIS — N72 Inflammatory disease of cervix uteri: Secondary | ICD-10-CM | POA: Diagnosis not present

## 2019-04-24 DIAGNOSIS — N135 Crossing vessel and stricture of ureter without hydronephrosis: Secondary | ICD-10-CM

## 2019-04-24 DIAGNOSIS — N801 Endometriosis of ovary: Secondary | ICD-10-CM | POA: Insufficient documentation

## 2019-04-24 DIAGNOSIS — N858 Other specified noninflammatory disorders of uterus: Secondary | ICD-10-CM | POA: Diagnosis not present

## 2019-04-24 DIAGNOSIS — N736 Female pelvic peritoneal adhesions (postinfective): Secondary | ICD-10-CM

## 2019-04-24 DIAGNOSIS — N83202 Unspecified ovarian cyst, left side: Secondary | ICD-10-CM | POA: Diagnosis not present

## 2019-04-24 DIAGNOSIS — D270 Benign neoplasm of right ovary: Secondary | ICD-10-CM | POA: Diagnosis not present

## 2019-04-24 DIAGNOSIS — N950 Postmenopausal bleeding: Secondary | ICD-10-CM | POA: Insufficient documentation

## 2019-04-24 DIAGNOSIS — I10 Essential (primary) hypertension: Secondary | ICD-10-CM | POA: Diagnosis not present

## 2019-04-24 DIAGNOSIS — N83201 Unspecified ovarian cyst, right side: Secondary | ICD-10-CM

## 2019-04-24 DIAGNOSIS — Z803 Family history of malignant neoplasm of breast: Secondary | ICD-10-CM | POA: Diagnosis not present

## 2019-04-24 DIAGNOSIS — N803 Endometriosis of pelvic peritoneum: Secondary | ICD-10-CM | POA: Diagnosis not present

## 2019-04-24 DIAGNOSIS — K682 Retroperitoneal fibrosis: Secondary | ICD-10-CM

## 2019-04-24 DIAGNOSIS — N838 Other noninflammatory disorders of ovary, fallopian tube and broad ligament: Secondary | ICD-10-CM | POA: Diagnosis present

## 2019-04-24 DIAGNOSIS — E663 Overweight: Secondary | ICD-10-CM | POA: Insufficient documentation

## 2019-04-24 DIAGNOSIS — Z6829 Body mass index (BMI) 29.0-29.9, adult: Secondary | ICD-10-CM | POA: Insufficient documentation

## 2019-04-24 DIAGNOSIS — N839 Noninflammatory disorder of ovary, fallopian tube and broad ligament, unspecified: Secondary | ICD-10-CM | POA: Diagnosis present

## 2019-04-24 DIAGNOSIS — N800 Endometriosis of uterus: Secondary | ICD-10-CM | POA: Diagnosis not present

## 2019-04-24 DIAGNOSIS — N888 Other specified noninflammatory disorders of cervix uteri: Secondary | ICD-10-CM | POA: Diagnosis not present

## 2019-04-24 DIAGNOSIS — Z79899 Other long term (current) drug therapy: Secondary | ICD-10-CM | POA: Insufficient documentation

## 2019-04-24 DIAGNOSIS — R971 Elevated cancer antigen 125 [CA 125]: Secondary | ICD-10-CM | POA: Diagnosis present

## 2019-04-24 DIAGNOSIS — D259 Leiomyoma of uterus, unspecified: Secondary | ICD-10-CM | POA: Insufficient documentation

## 2019-04-24 HISTORY — PX: ROBOTIC ASSISTED TOTAL HYSTERECTOMY WITH BILATERAL SALPINGO OOPHERECTOMY: SHX6086

## 2019-04-24 LAB — TYPE AND SCREEN
ABO/RH(D): AB POS
Antibody Screen: NEGATIVE

## 2019-04-24 SURGERY — HYSTERECTOMY, TOTAL, ROBOT-ASSISTED, LAPAROSCOPIC, WITH BILATERAL SALPINGO-OOPHORECTOMY
Anesthesia: General

## 2019-04-24 MED ORDER — LIDOCAINE 2% (20 MG/ML) 5 ML SYRINGE
INTRAMUSCULAR | Status: DC | PRN
  Administered 2019-04-24: 80 mg via INTRAVENOUS

## 2019-04-24 MED ORDER — DEXAMETHASONE SODIUM PHOSPHATE 4 MG/ML IJ SOLN: 4.0000 mg | INTRAMUSCULAR | Status: DC

## 2019-04-24 MED ORDER — SUGAMMADEX SODIUM 200 MG/2ML IV SOLN
INTRAVENOUS | Status: DC | PRN
  Administered 2019-04-24: 160 mg via INTRAVENOUS

## 2019-04-24 MED ORDER — LACTATED RINGERS IR SOLN
Status: DC | PRN
  Administered 2019-04-24: 1000 mL

## 2019-04-24 MED ORDER — DEXAMETHASONE SODIUM PHOSPHATE 10 MG/ML IJ SOLN
INTRAMUSCULAR | Status: DC | PRN
  Administered 2019-04-24: 8 mg via INTRAVENOUS

## 2019-04-24 MED ORDER — PROPOFOL 10 MG/ML IV BOLUS
INTRAVENOUS | Status: AC
  Filled 2019-04-24: qty 20

## 2019-04-24 MED ORDER — SODIUM CHLORIDE 0.9% FLUSH: 3.0000 mL | Freq: Two times a day (BID) | INTRAVENOUS | Status: DC

## 2019-04-24 MED ORDER — ROCURONIUM BROMIDE 10 MG/ML (PF) SYRINGE
PREFILLED_SYRINGE | INTRAVENOUS | Status: DC | PRN
  Administered 2019-04-24: 50 mg via INTRAVENOUS
  Administered 2019-04-24: 20 mg via INTRAVENOUS

## 2019-04-24 MED ORDER — ACETAMINOPHEN 500 MG PO TABS
1000.0000 mg | ORAL_TABLET | ORAL | Status: AC
  Administered 2019-04-24: 06:00:00 1000 mg via ORAL
  Filled 2019-04-24: qty 2

## 2019-04-24 MED ORDER — KETOROLAC TROMETHAMINE 30 MG/ML IJ SOLN
INTRAMUSCULAR | Status: AC
  Filled 2019-04-24: qty 1

## 2019-04-24 MED ORDER — MIDAZOLAM HCL 5 MG/5ML IJ SOLN
INTRAMUSCULAR | Status: DC | PRN
  Administered 2019-04-24: 2 mg via INTRAVENOUS

## 2019-04-24 MED ORDER — CELECOXIB 200 MG PO CAPS
400.0000 mg | ORAL_CAPSULE | ORAL | Status: AC
  Administered 2019-04-24: 06:00:00 400 mg via ORAL
  Filled 2019-04-24: qty 2

## 2019-04-24 MED ORDER — LIDOCAINE 2% (20 MG/ML) 5 ML SYRINGE
INTRAMUSCULAR | Status: DC | PRN
  Administered 2019-04-24: 1.5 mg/kg/h via INTRAVENOUS

## 2019-04-24 MED ORDER — CEFAZOLIN SODIUM-DEXTROSE 2-4 GM/100ML-% IV SOLN
2.0000 g | INTRAVENOUS | Status: AC
  Administered 2019-04-24: 2 g via INTRAVENOUS
  Filled 2019-04-24: qty 100

## 2019-04-24 MED ORDER — ACETAMINOPHEN 500 MG PO TABS: 1000.0000 mg | ORAL_TABLET | Freq: Once | ORAL | Status: DC

## 2019-04-24 MED ORDER — MIDAZOLAM HCL 2 MG/2ML IJ SOLN
INTRAMUSCULAR | Status: AC
  Filled 2019-04-24: qty 2

## 2019-04-24 MED ORDER — ENOXAPARIN SODIUM 40 MG/0.4ML ~~LOC~~ SOLN
40.0000 mg | SUBCUTANEOUS | Status: AC
  Administered 2019-04-24: 06:00:00 40 mg via SUBCUTANEOUS
  Filled 2019-04-24: qty 0.4

## 2019-04-24 MED ORDER — FENTANYL CITRATE (PF) 100 MCG/2ML IJ SOLN
INTRAMUSCULAR | Status: DC | PRN
  Administered 2019-04-24 (×5): 50 ug via INTRAVENOUS

## 2019-04-24 MED ORDER — STERILE WATER FOR IRRIGATION IR SOLN
Status: DC | PRN
  Administered 2019-04-24: 1000 mL

## 2019-04-24 MED ORDER — FENTANYL CITRATE (PF) 250 MCG/5ML IJ SOLN
INTRAMUSCULAR | Status: AC
  Filled 2019-04-24: qty 5

## 2019-04-24 MED ORDER — ONDANSETRON HCL 4 MG/2ML IJ SOLN
INTRAMUSCULAR | Status: AC
  Filled 2019-04-24: qty 2

## 2019-04-24 MED ORDER — HYDROMORPHONE HCL 1 MG/ML IJ SOLN
INTRAMUSCULAR | Status: AC
  Filled 2019-04-24: qty 1

## 2019-04-24 MED ORDER — BUPIVACAINE HCL (PF) 0.25 % IJ SOLN
INTRAMUSCULAR | Status: AC
  Filled 2019-04-24: qty 30

## 2019-04-24 MED ORDER — LIDOCAINE HCL 2 % IJ SOLN
INTRAMUSCULAR | Status: AC
  Filled 2019-04-24: qty 20

## 2019-04-24 MED ORDER — ONDANSETRON HCL 4 MG/2ML IJ SOLN
INTRAMUSCULAR | Status: DC | PRN
  Administered 2019-04-24: 4 mg via INTRAVENOUS

## 2019-04-24 MED ORDER — MEPERIDINE HCL 50 MG/ML IJ SOLN: 6.2500 mg | INTRAMUSCULAR | Status: DC | PRN

## 2019-04-24 MED ORDER — SCOPOLAMINE 1 MG/3DAYS TD PT72
1.0000 | MEDICATED_PATCH | TRANSDERMAL | Status: DC
  Administered 2019-04-24: 1.5 mg via TRANSDERMAL
  Filled 2019-04-24: qty 1

## 2019-04-24 MED ORDER — KETOROLAC TROMETHAMINE 30 MG/ML IJ SOLN
30.0000 mg | Freq: Once | INTRAMUSCULAR | Status: AC | PRN
  Administered 2019-04-24: 11:00:00 30 mg via INTRAVENOUS

## 2019-04-24 MED ORDER — OXYCODONE HCL 5 MG PO TABS: 5.0000 mg | ORAL_TABLET | Freq: Once | ORAL | Status: DC | PRN

## 2019-04-24 MED ORDER — PROMETHAZINE HCL 25 MG/ML IJ SOLN: 6.2500 mg | INTRAMUSCULAR | Status: DC | PRN

## 2019-04-24 MED ORDER — GABAPENTIN 300 MG PO CAPS
300.0000 mg | ORAL_CAPSULE | ORAL | Status: AC
  Administered 2019-04-24: 06:00:00 300 mg via ORAL
  Filled 2019-04-24: qty 1

## 2019-04-24 MED ORDER — PROPOFOL 10 MG/ML IV BOLUS
INTRAVENOUS | Status: DC | PRN
  Administered 2019-04-24: 200 mg via INTRAVENOUS

## 2019-04-24 MED ORDER — LACTATED RINGERS IV SOLN
INTRAVENOUS | Status: DC
  Administered 2019-04-24: 06:00:00 via INTRAVENOUS

## 2019-04-24 MED ORDER — OXYCODONE HCL 5 MG/5ML PO SOLN: 5.0000 mg | Freq: Once | ORAL | Status: DC | PRN

## 2019-04-24 MED ORDER — SCOPOLAMINE 1 MG/3DAYS TD PT72: 1.0000 | MEDICATED_PATCH | TRANSDERMAL | Status: DC

## 2019-04-24 MED ORDER — BUPIVACAINE HCL 0.25 % IJ SOLN
INTRAMUSCULAR | Status: DC | PRN
  Administered 2019-04-24: 20 mL

## 2019-04-24 MED ORDER — HYDROMORPHONE HCL 1 MG/ML IJ SOLN
0.2500 mg | INTRAMUSCULAR | Status: DC | PRN
  Administered 2019-04-24: 11:00:00 0.5 mg via INTRAVENOUS

## 2019-04-24 MED ORDER — ROCURONIUM BROMIDE 10 MG/ML (PF) SYRINGE
PREFILLED_SYRINGE | INTRAVENOUS | Status: AC
  Filled 2019-04-24: qty 10

## 2019-04-24 MED ORDER — LIDOCAINE 2% (20 MG/ML) 5 ML SYRINGE
INTRAMUSCULAR | Status: AC
  Filled 2019-04-24: qty 5

## 2019-04-24 MED ORDER — DEXAMETHASONE SODIUM PHOSPHATE 10 MG/ML IJ SOLN
INTRAMUSCULAR | Status: AC
  Filled 2019-04-24: qty 1

## 2019-04-24 SURGICAL SUPPLY — 68 items
ADH SKN CLS APL DERMABOND .7 (GAUZE/BANDAGES/DRESSINGS) ×1
AGENT HMST KT MTR STRL THRMB (HEMOSTASIS)
APL ESCP 34 STRL LF DISP (HEMOSTASIS)
APPLICATOR SURGIFLO ENDO (HEMOSTASIS) IMPLANT
BAG LAPAROSCOPIC 12 15 PORT 16 (BASKET) IMPLANT
BAG RETRIEVAL 12/15 (BASKET) ×2
BAG SPEC RTRVL LRG 6X4 10 (ENDOMECHANICALS)
BLADE SURG SZ10 CARB STEEL (BLADE) IMPLANT
COVER BACK TABLE 60X90IN (DRAPES) ×2 IMPLANT
COVER TIP SHEARS 8 DVNC (MISCELLANEOUS) ×1 IMPLANT
COVER TIP SHEARS 8MM DA VINCI (MISCELLANEOUS) ×2
COVER WAND RF STERILE (DRAPES) IMPLANT
DECANTER SPIKE VIAL GLASS SM (MISCELLANEOUS) ×1 IMPLANT
DERMABOND ADVANCED (GAUZE/BANDAGES/DRESSINGS) ×1
DERMABOND ADVANCED .7 DNX12 (GAUZE/BANDAGES/DRESSINGS) ×1 IMPLANT
DRAPE ARM DVNC X/XI (DISPOSABLE) ×4 IMPLANT
DRAPE COLUMN DVNC XI (DISPOSABLE) ×1 IMPLANT
DRAPE DA VINCI XI ARM (DISPOSABLE) ×4
DRAPE DA VINCI XI COLUMN (DISPOSABLE) ×1
DRAPE SHEET LG 3/4 BI-LAMINATE (DRAPES) ×2 IMPLANT
DRAPE SURG IRRIG POUCH 19X23 (DRAPES) ×2 IMPLANT
DRSG OPSITE POSTOP 4X6 (GAUZE/BANDAGES/DRESSINGS) IMPLANT
DRSG OPSITE POSTOP 4X8 (GAUZE/BANDAGES/DRESSINGS) IMPLANT
ELECT REM PT RETURN 15FT ADLT (MISCELLANEOUS) ×2 IMPLANT
GLOVE BIO SURGEON STRL SZ 6 (GLOVE) ×8 IMPLANT
GLOVE BIO SURGEON STRL SZ 6.5 (GLOVE) ×3 IMPLANT
GOWN STRL REUS W/ TWL LRG LVL3 (GOWN DISPOSABLE) ×4 IMPLANT
GOWN STRL REUS W/TWL LRG LVL3 (GOWN DISPOSABLE) ×8
HOLDER FOLEY CATH W/STRAP (MISCELLANEOUS) ×1 IMPLANT
IRRIG SUCT STRYKERFLOW 2 WTIP (MISCELLANEOUS) ×2
IRRIGATION SUCT STRKRFLW 2 WTP (MISCELLANEOUS) ×1 IMPLANT
KIT PROCEDURE DA VINCI SI (MISCELLANEOUS)
KIT PROCEDURE DVNC SI (MISCELLANEOUS) IMPLANT
KIT TURNOVER KIT A (KITS) ×1 IMPLANT
MANIPULATOR UTERINE 4.5 ZUMI (MISCELLANEOUS) ×2 IMPLANT
NDL SPNL 18GX3.5 QUINCKE PK (NEEDLE) IMPLANT
NEEDLE HYPO 22GX1.5 SAFETY (NEEDLE) ×1 IMPLANT
NEEDLE SPNL 18GX3.5 QUINCKE PK (NEEDLE) IMPLANT
OBTURATOR OPTICAL STANDARD 8MM (TROCAR) ×1
OBTURATOR OPTICAL STND 8 DVNC (TROCAR) ×1
OBTURATOR OPTICALSTD 8 DVNC (TROCAR) ×1 IMPLANT
PACK ROBOT GYN CUSTOM WL (TRAY / TRAY PROCEDURE) ×2 IMPLANT
PAD POSITIONING PINK XL (MISCELLANEOUS) ×2 IMPLANT
PORT ACCESS TROCAR AIRSEAL 12 (TROCAR) ×1 IMPLANT
PORT ACCESS TROCAR AIRSEAL 5M (TROCAR) ×1
POUCH SPECIMEN RETRIEVAL 10MM (ENDOMECHANICALS) IMPLANT
SEAL CANN UNIV 5-8 DVNC XI (MISCELLANEOUS) ×3 IMPLANT
SEAL XI 5MM-8MM UNIVERSAL (MISCELLANEOUS) ×4
SET TRI-LUMEN FLTR TB AIRSEAL (TUBING) ×2 IMPLANT
SPONGE LAP 18X18 X RAY DECT (DISPOSABLE) IMPLANT
SURGIFLO W/THROMBIN 8M KIT (HEMOSTASIS) IMPLANT
SUT MNCRL AB 4-0 PS2 18 (SUTURE) IMPLANT
SUT PDS AB 1 TP1 96 (SUTURE) IMPLANT
SUT VIC AB 0 CT1 27 (SUTURE)
SUT VIC AB 0 CT1 27XBRD ANTBC (SUTURE) IMPLANT
SUT VIC AB 2-0 CT1 27 (SUTURE)
SUT VIC AB 2-0 CT1 TAPERPNT 27 (SUTURE) IMPLANT
SUT VIC AB 3-0 SH 27 (SUTURE) ×10
SUT VIC AB 3-0 SH 27XBRD (SUTURE) IMPLANT
SUT VICRYL 4-0 PS2 18IN ABS (SUTURE) ×4 IMPLANT
SYR 10ML LL (SYRINGE) IMPLANT
TOWEL OR NON WOVEN STRL DISP B (DISPOSABLE) ×2 IMPLANT
TRAP SPECIMEN MUCOUS 40CC (MISCELLANEOUS) ×1 IMPLANT
TRAY FOLEY MTR SLVR 16FR STAT (SET/KITS/TRAYS/PACK) ×2 IMPLANT
TROCAR XCEL NON-BLD 5MMX100MML (ENDOMECHANICALS) IMPLANT
UNDERPAD 30X36 HEAVY ABSORB (UNDERPADS AND DIAPERS) ×3 IMPLANT
WATER STERILE IRR 1000ML POUR (IV SOLUTION) ×2 IMPLANT
YANKAUER SUCT BULB TIP 10FT TU (MISCELLANEOUS) IMPLANT

## 2019-04-24 NOTE — Discharge Instructions (Addendum)
Return to work: 4 weeks (2 weeks with physical restrictions). ° °Activity: °1. Be up and out of the bed during the day.  Take a nap if needed.  You may walk up steps but be careful and use the hand rail.  Stair climbing will tire you more than you think, you may need to stop part way and rest.  ° °2. No lifting or straining for 4 weeks. ° °3. No driving for 1 weeks.  Do Not drive if you are taking narcotic pain medicine. ° °4. Shower daily.  Use soap and water on your incision and pat dry; don't rub.  ° °5. No sexual activity and nothing in the vagina for 8 weeks. ° °Medications:  °- Take ibuprofen and tylenol first line for pain control. Take these regularly (every 6 hours) to decrease the build up of pain. ° °- If necessary, for severe pain not relieved by ibuprofen, contact Dr Rossi's office and you will be prescribed percocet. ° °- While taking percocet you should take sennakot every night to reduce the likelihood of constipation. If this causes diarrhea, stop its use. ° °Diet: °1. Low sodium Heart Healthy Diet is recommended. ° °2. It is safe to use a laxative if you have difficulty moving your bowels.  ° °Wound Care: °1. Keep clean and dry.  Shower daily. ° °Reasons to call the Doctor: ° °· Fever - Oral temperature greater than 100.4 degrees Fahrenheit °· Foul-smelling vaginal discharge °· Difficulty urinating °· Nausea and vomiting °· Increased pain at the site of the incision that is unrelieved with pain medicine. °· Difficulty breathing with or without chest pain °· New calf pain especially if only on one side °· Sudden, continuing increased vaginal bleeding with or without clots. °  °Follow-up: °1. See Emma Rossi in 4 weeks. ° °Contacts: °For questions or concerns you should contact: ° °Dr. Emma Rossi at [phone] °After hours and on week-ends call [phone] and ask to speak to the physician on call for Gynecologic Oncology ° °After Your Surgery ° °The information in this section will tell you what  to expect after your surgery, both during your stay and after you leave. You will learn how to safely recover from your surgery. Write down any questions you have and be sure to ask your doctor or nurse. °What to Expect °When you wake up after your surgery, you will be in the Post-Anesthesia Care Unit (PACU) or your °recovery room. °A nurse will be monitoring your body temperature, blood pressure, pulse, and oxygen levels. °You may have a urinary catheter in your bladder to help monitor the amount of urine you are making. It should come out before you go home. You will also have compression boots on your lower legs to help your circulation. °Your pain medication will be given through an IV line or in tablet form. If you are having pain, tell your nurse. °Your nurse will tell you how to recover from your surgery. Below are examples of ways you can help yourself recover safely. °• You will be encouraged to walk with the help of your nurse or physical therapist. We will give you medication to relieve pain. Walking helps reduce the risk for blood clots and pneumonia. It also helps to stimulate your bowels so they begin working again. °• Use your incentive spirometer. This will help your lungs expand, which prevents pneumonia. For more information, read How to Use Your Incentive Spirometer. °Commonly Asked Questions °Will I have pain after   surgery? °Yes, you will have some pain after your surgery, especially in the first few days. Your doctor and nurse will ask you about your pain often. You will be given medication to manage your pain as needed. If your pain is not relieved, please tell your doctor or nurse. It is important to control your pain so you can cough, breathe deeply, use your incentive spirometer, and get out of bed and walk. °Will I be able to eat? °Yes, you will be able to eat a regular diet or eat as tolerated. You should start with foods that are soft and easy to digest such as apple sauce and chicken  noodle soup. Eat small meals frequently, and then advance to regular foods. °If you experience bloating, gas, or cramps, limit high-fiber foods, including whole grain breads and cereal, nuts, seeds, salads, fresh fruit, broccoli, cabbage, and cauliflower. °Will I have pain when I am home? °The length of time each person has pain or discomfort varies. You may still have some pain when you go home and will probably be taking pain medication. Follow the guidelines below. °• Take your medications as directed and as needed. °• Call your doctor if the medication prescribed for you doesn't relieve your pain. °• Don't drive or drink alcohol while you're taking prescription pain medication. °• As your incision heals, you will have less pain and need less pain medication. A mild pain reliever such as acetaminophen (Tylenol®) or ibuprofen (Advil®) will relieve aches and discomfort. However, large quantities of acetaminophen may be harmful to your liver. Don't take more acetaminophen than the amount directed on the bottle or as instructed by your doctor or nurse. °• Pain medication should help you as you resume your normal activities. Take enough medication to do your exercises comfortably. Pain medication is most effective 30 to 45 minutes after taking it. °• Keep track of when you take your pain medication. Taking it when your pain first begins is more effective than waiting for the pain to get worse. °Pain medication may cause constipation (having fewer bowel movements than what is normal for you). °How can I prevent constipation? °• Go to the bathroom at the same time every day. Your body will get used to going at that time. °• If you feel the urge to go, don't put it off. Try to use the bathroom 5 to 15 minutes after meals. °• After breakfast is a Rohaan Durnil time to move your bowels. The reflexes in your colon are strongest at this time. °• Exercise, if you can. Walking is an excellent form of exercise. °• Drink 8 (8-ounce)  glasses (2 liters) of liquids daily, if you can. Drink water, juices, soups, ice cream shakes, and other drinks that don't have caffeine. Drinks with caffeine, such as coffee and soda, pull fluid out of the body. °• Slowly increase the fiber in your diet to 25 to 35 grams per day. Fruits, vegetables, whole grains, and cereals contain fiber. If you have an ostomy or have had recent bowel surgery, check with your doctor or nurse before making any changes in your diet. °• Both over-the-counter and prescription medications are available to treat constipation. Start with 1 of the following over-the-counter medications first: °o Docusate sodium (Colace®) 100 mg. Take ___1__ capsules _2____ times a day. This is a stool softener that causes few side effects. Don't take it with mineral oil. °o Polyethylene glycol (MiraLAX®) 17 grams daily. °o Senna (Senokot®) 2 tablets at bedtime. This is a stimulant laxative,   which can cause cramping.  If you haven't had a bowel movement in 2 days, call your doctor or nurse. Can I shower? Yes, you should shower 24 hours after your surgery. Be sure to shower every day. Taking a warm shower is relaxing and can help decrease muscle aches. Use soap when you shower and gently wash your incision. Pat the areas dry with a towel after showering, and leave your incision uncovered (unless there is drainage). Call your doctor if you see any redness or drainage from your incision. Don't take tub baths until you discuss it with your doctor at the first appointment after your surgery. How do I care for my incisions? You will have several small incisions on your abdomen. The incisions are closed with Steri-Strips or Dermabond. You may also have square white dressings on your incisions (Primapore). You can remove these in the shower 24 hours after your surgery. You should clean your incisions with soap and water. If you go home with Steri-Strips on your incision, they will loosen and may fall off  by themselves. If they haven't fallen off within 10 days, you can remove them. If you go home with Dermabond over your sutures (stitches), it will also loosen and peel off. What are the most common symptoms after a hysterectomy? It's common for you to have some vaginal spotting or light bleeding. You should monitor this with a pad or a panty liner. If you have having heavy bleeding (bleeding through a pad or liner every 1 to 2 hours), call your doctor right away. It's also common to have some discomfort after surgery from the air that was pumped into your abdomen during surgery. To help with this, walk, drink plenty of liquids and make sure to take the stool softeners you received. When is it safe for me to drive? You may resume driving 2 weeks after surgery, as long as you are not taking pain medication that may make you drowsy. When can I resume sexual activity? Do not place anything in your vagina or have vaginal intercourse for 8 weeks after your surgery. Some people will need to wait longer than 8 weeks, so speak with your doctor before resuming sexual intercourse. Will I be able to travel? Yes, you can travel. If you are traveling by plane within a few weeks after your surgery, make sure you get up and walk every hour. Be sure to stretch your legs, drink plenty of liquids, and keep your feet elevated when possible. Will I need any supplies? Most people do not need any supplies after the surgery. In the rare case that you do need supplies, such as tubes or drains, your nurse will order them for you. When can I return to work? The time it takes to return to work depends on the type of work you do, the type of surgery you had, and how fast your body heals. Most people can return to work about 2 to 4 weeks after the surgery. What exercises can I do? Exercise will help you gain strength and feel better. Walking and stair climbing are excellent forms of exercise. Gradually increase the distance you  walk. Climb stairs slowly, resting or stopping as needed. Ask your doctor or nurse before starting more strenuous exercises. When can I lift heavy objects? Most people should not lift anything heavier than 10 pounds (4.5 kilograms) for at least 4 weeks after surgery. Speak with your doctor about when you can do heavy lifting. How can I cope with  my feelings? After surgery for a serious illness, you may have new and upsetting feelings. Many people say they felt weepy, sad, worried, nervous, irritable, and angry at one time or another. You may find that you can't control some of these feelings. If this happens, it's a Ninfa Giannelli idea to seek emotional support. The first step in coping is to talk about how you feel. Family and friends can help. Your nurse, doctor, and social worker can reassure, support, and guide you. It's always a Carrie Murillo idea to let these professionals know how you, your family, and your friends are feeling emotionally. Many resources are available to patients and their families. Whether you're in the hospital or at home, the nurses, doctors, and social workers are here to help you and your family and friends handle the emotional aspects of your illness. When is my first appointment after surgery? Your first appointment after surgery will be 2 to 4 weeks after surgery. Your nurse will give you instructions on how to make this appointment, including the phone number to call. What if I have other questions? If you have any questions or concerns, please talk with your doctor or nurse. You can reach them Monday through Friday from 9:00 am to 5:00 pm. After 5:00 pm, during the weekend, and on holidays, call [phone] and ask for the doctor on call for your doctor.   Have a temperature of 101 F (38.3 C) or higher  Have pain that does not get better with pain medication  Have redness, drainage, or swelling from your incisions   General Anesthesia, Adult, Care After This sheet gives you  information about how to care for yourself after your procedure. Your health care provider may also give you more specific instructions. If you have problems or questions, contact your health care provider. What can I expect after the procedure? After the procedure, the following side effects are common:  Pain or discomfort at the IV site.  Nausea.  Vomiting.  Sore throat.  Trouble concentrating.  Feeling cold or chills.  Weak or tired.  Sleepiness and fatigue.  Soreness and body aches. These side effects can affect parts of the body that were not involved in surgery. Follow these instructions at home:  For at least 24 hours after the procedure:  Have a responsible adult stay with you. It is important to have someone help care for you until you are awake and alert.  Rest as needed.  Do not: ? Participate in activities in which you could fall or become injured. ? Drive. ? Use heavy machinery. ? Drink alcohol. ? Take sleeping pills or medicines that cause drowsiness. ? Make important decisions or sign legal documents. ? Take care of children on your own. Eating and drinking  Follow any instructions from your health care provider about eating or drinking restrictions.  When you feel hungry, start by eating small amounts of foods that are soft and easy to digest (bland), such as toast. Gradually return to your regular diet.  Drink enough fluid to keep your urine pale yellow.  If you vomit, rehydrate by drinking water, juice, or clear broth. General instructions  If you have sleep apnea, surgery and certain medicines can increase your risk for breathing problems. Follow instructions from your health care provider about wearing your sleep device: ? Anytime you are sleeping, including during daytime naps. ? While taking prescription pain medicines, sleeping medicines, or medicines that make you drowsy.  Return to your normal activities as told by  your health care  provider. Ask your health care provider what activities are safe for you.  Take over-the-counter and prescription medicines only as told by your health care provider.  If you smoke, do not smoke without supervision.  Keep all follow-up visits as told by your health care provider. This is important. Contact a health care provider if:  You have nausea or vomiting that does not get better with medicine.  You cannot eat or drink without vomiting.  You have pain that does not get better with medicine.  You are unable to pass urine.  You develop a skin rash.  You have a fever.  You have redness around your IV site that gets worse. Get help right away if:  You have difficulty breathing.  You have chest pain.  You have blood in your urine or stool, or you vomit blood. Summary  After the procedure, it is common to have a sore throat or nausea. It is also common to feel tired.  Have a responsible adult stay with you for the first 24 hours after general anesthesia. It is important to have someone help care for you until you are awake and alert.  When you feel hungry, start by eating small amounts of foods that are soft and easy to digest (bland), such as toast. Gradually return to your regular diet.  Drink enough fluid to keep your urine pale yellow.  Return to your normal activities as told by your health care provider. Ask your health care provider what activities are safe for you. This information is not intended to replace advice given to you by your health care provider. Make sure you discuss any questions you have with your health care provider. Document Released: 09/20/2000 Document Revised: 06/17/2017 Document Reviewed: 01/28/2017 Elsevier Patient Education  2020 [employer].

## 2019-04-24 NOTE — Anesthesia Postprocedure Evaluation (Signed)
Anesthesia Post Note  Patient: Carrie Murillo  Procedure(s) Performed: XI ROBOTIC ASSISTED TOTAL HYSTERECTOMY greater than 250g, WITH BILATERAL SALPINGO OOPHORECTOMY, LYSIS OF ADHESIONS (N/A )     Patient location during evaluation: PACU Anesthesia Type: General Level of consciousness: awake and alert, oriented and patient cooperative Pain management: pain level controlled Vital Signs Assessment: post-procedure vital signs reviewed and stable Respiratory status: spontaneous breathing, nonlabored ventilation and respiratory function stable Cardiovascular status: blood pressure returned to baseline and stable Postop Assessment: no apparent nausea or vomiting Anesthetic complications: no    Last Vitals:  Vitals:   04/24/19 1030 04/24/19 1045  BP: (!) 156/98 (!) 146/102  Pulse: 75 86  Resp: 13 18  Temp:    SpO2: 100% 99%    Last Pain:  Vitals:   04/24/19 1050  TempSrc:   PainSc: [place]

## 2019-04-24 NOTE — Interval H&P Note (Signed)
History and Physical Interval Note:  04/24/2019 7:07 AM  Carrie Murillo  has presented today for surgery, with the diagnosis of right ovarian mass fibroid uterus EA125 elevation.  The various methods of treatment have been discussed with the patient and family. After consideration of risks, benefits and other options for treatment, the patient has consented to  Procedure(s): XI ROBOTIC ASSISTED TOTAL HYSTERECTOMY greater than 250g, WITH BILATERAL SALPINGO OOPHORECTOMY, possible staging and possible laporatomy (N/A) as a surgical intervention.  The patient's history has been reviewed, patient examined, no change in status, stable for surgery.  I have reviewed the patient's chart and labs.  Questions were answered to the patient's satisfaction.     Thereasa Solo

## 2019-04-24 NOTE — Transfer of Care (Signed)
Immediate Anesthesia Transfer of Care Note  Patient: Carrie Murillo  Procedure(s) Performed: XI ROBOTIC ASSISTED TOTAL HYSTERECTOMY greater than 250g, WITH BILATERAL SALPINGO OOPHORECTOMY, LYSIS OF ADHESIONS (N/A )  Patient Location: PACU  Anesthesia Type:General  Level of Consciousness: awake, alert , oriented and patient cooperative  Airway & Oxygen Therapy: Patient Spontanous Breathing and Patient connected to face mask oxygen  Post-op Assessment: Report given to RN, Post -op Vital signs reviewed and stable and Patient moving all extremities  Post vital signs: Reviewed and stable  Last Vitals:  Vitals Value Taken Time  BP 147/98 04/24/19 1027  Temp 36.4 C 04/24/19 1027  Pulse 86 04/24/19 1028  Resp 16 04/24/19 1028  SpO2 100 % 04/24/19 1028  Vitals shown include unvalidated device data.  Last Pain:  Vitals:   04/24/19 0544  TempSrc:   PainSc: 0-No pain         Complications: No apparent anesthesia complications

## 2019-04-24 NOTE — Op Note (Signed)
OPERATIVE NOTE 04/24/19  Surgeon: Donaciano Eva   Assistants: Dr Lahoma Crocker (an MD assistant was necessary for tissue manipulation, management of robotic instrumentation, retraction and positioning due to the complexity of the case and hospital policies).   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  This mass was densely adherent to the right ovarian fossa, sigmoid colon including the bowel wall, posterior uterus, and right ureter.  There were dense adhesions on the left between the sigmoid colon mesentery and the left tube and ovary.  These dense pelvic adhesions obliterating both posterior and anterior cul-de-sacs and fusing the sigmoid colon with the posterior uterus and bilateral ovaries was consistent with stage IV endometriosis.  This increased the complexity of the case by increasing the duration of the case by 90 minutes due to the extensive lysis of adhesions, skeletonization of structures, and normalization of anatomy that was necessary to proceed.  Estimated Blood Loss:  less than 100 mL      Total IV Fluids: 900 ml         Specimens: Pelvic  washings, right tube and ovary, uterus cervix left tube and ovary.         Complications:  None; patient tolerated the procedure well.         Disposition: PACU - hemodynamically stable.  Procedure Details  The patient was seen in the Holding Room. The risks, benefits, complications, treatment options, and expected outcomes were discussed with the patient.  The patient concurred with the proposed plan, giving informed consent.  The site of surgery properly noted/marked. The patient was identified as Carrie Murillo and the procedure verified as a Robotic-assisted hysterectomy with bilateral salpingo oophorectomy. A Time Out was held and the above information confirmed.  After induction of anesthesia, the patient was draped and prepped in the usual sterile manner. Pt was placed in supine position after anesthesia and draped and prepped in the usual sterile manner. The abdominal drape was placed after the CholoraPrep had been allowed to dry for 3 minutes.  Her arms were tucked to her side with all appropriate precautions.  The shoulders were stabilized with padded shoulder blocks applied to the acromium processes.  The patient was placed in the semi-lithotomy position in [place].  The perineum was prepped with Betadine. The patient was then prepped. Foley catheter was placed.  A sterile speculum was placed in the vagina.  The cervix was grasped with a single-tooth tenaculum and dilated with Kennon Rounds dilators.  The ZUMI uterine manipulator with a medium colpotomizer ring was placed without difficulty.  A pneum occluder balloon was placed over the manipulator.  OG tube placement was confirmed and to suction.   Next, a 5 mm skin incision was made 1 cm below the subcostal margin in the midclavicular line.  The  5 mm Optiview port and scope was used for direct entry.  Opening pressure was under 10 mm CO2.  The abdomen was insufflated and the findings were noted as above.   At this point and all points during the  procedure, the patient's intra-abdominal pressure did not exceed 15 mmHg. Next, a 10 mm skin incision was made in the umbilicus and a right and left port was placed about 10 cm lateral to the robot port on the right and left side.  A fourth arm was placed in the left lower quadrant 2 cm above and superior and medial to the anterior superior iliac spine.  All ports were placed under direct visualization.  The patient was placed in steep Trendelenburg.  Bowel was folded away into the upper abdomen.  The robot was docked in the normal manner.  For 90 minutes there was comprehensive shop lysis of adhesions to carefully separate the fusion planes between sigmoid colon and the bilateral ovaries and the posterior cul-de-sac.  During this time there was no entry into the sigmoid colon but there was deserosalization of 2, 2 cm areas.  The left ovarian mass was densely adherent to the ovarian fossa and this was carefully mobilized and elevated with sharp dissection.  The hysterectomy was started after the round ligament on the right side was incised and the retroperitoneum was entered and the pararectal space was developed.  The ureter was noted to be on the medial leaf of the broad ligament.  The peritoneum above the ureter was incised and stretched and the infundibulopelvic ligament was skeletonized, cauterized and cut.  The right ovarian pedicle was then elevated which facilitated skeletonization and mobilization of the right ureter to lateralize it.  This freed it from its dense retroperitoneal fibrotic adhesions to the right ovarian capsule.  Once the ureter had been comprehensively skeletonized mobilized laterally the peritoneum was taken down to the level of the Koh ring posteriorly requiring additional skeletonization and separation of the right ovarian fossa from the mass at the sigmoid colon which was densely fused to both.  The posterior peritoneum was taken down to the level of the KOH ring.  The anterior  peritoneum was also taken down.  This required extensive dissection of the bladder where it was fused to the anterior cul-de-sac and anteriorly uterine fundus due to prior surgery and endometriosis.  Reticular sharp dissection was performed.  The bladder flap was created to the level of the KOH ring.  The uterine artery on the right side was skeletonized, cauterized and cut in the normal manner.  A similar procedure was performed on the left.  On the left there was no ovarian mass however the left ovary remained adherent to the posterior cul-de-sac, and left ovarian fossa.  There were also dense adhesions to the sigmoid mesentery and epiploica which were wrapping around the left uterine cornua.  The colpotomy was made and the uterus, cervix, bilateral ovaries and tubes were amputated and delivered through the vagina.    The trigone of the bladder was noted to be thinned in the midline.  This was imbricated with 3-0 Vicryl running suture.  Additionally there were 2 areas of deserosalization on the sigmoid colon bowel which were imbricated with 3-0 Vicryl and Lembert sutures.  Pedicles were inspected and excellent hemostasis was achieved.    Frozen section demonstrated endometrioma and benign endometrium.  The colpotomy at the vaginal cuff was closed with Vicryl on a CT1 needle in a running manner.  Irrigation was  used and excellent hemostasis was achieved.  At this point in the procedure was completed.  Robotic instruments were removed under direct visulaization.  The robot was undocked. The 10 mm ports were closed with Vicryl on a UR-5 needle and the fascia was closed with 0 Vicryl on a UR-5 needle.  The skin was closed with 4-0 Vicryl in a subcuticular manner.  Dermabond was applied.  Sponge, lap and needle counts correct x 2.  The patient was taken to the recovery room in stable condition.  The vagina was swabbed with  minimal bleeding noted.   All instrument and needle counts were correct x  3.   The  patient was transferred to the recovery room in a stable condition.  Donaciano Eva, MD

## 2019-04-24 NOTE — Anesthesia Procedure Notes (Signed)
Procedure Name: Intubation Date/Time: 04/24/2019 7:32 AM Performed by: Victoriano Lain, CRNA Pre-anesthesia Checklist: Patient identified, Emergency Drugs available, Suction available, Patient being monitored and Timeout performed Patient Re-evaluated:Patient Re-evaluated prior to induction Oxygen Delivery Method: Circle system utilized Preoxygenation: Pre-oxygenation with 100% oxygen Induction Type: IV induction Ventilation: Mask ventilation without difficulty Laryngoscope Size: Mac and 4 Grade View: Grade I Tube type: Oral Tube size: 7.5 mm Number of attempts: 1 Airway Equipment and Method: Stylet Placement Confirmation: ETT inserted through vocal cords under direct vision,  positive ETCO2 and breath sounds checked- equal and bilateral Secured at: 21 cm Tube secured with: Tape Dental Injury: Teeth and Oropharynx as per pre-operative assessment

## 2019-04-25 ENCOUNTER — Telehealth: Payer: Self-pay

## 2019-04-25 ENCOUNTER — Encounter (HOSPITAL_COMMUNITY): Payer: Self-pay | Admitting: Gynecologic Oncology

## 2019-04-25 NOTE — Telephone Encounter (Signed)
Carrie Murillo called this am. She c/o abdominal pain and shoulder pain.  She has mild nausea.  She is not passing gas. She is afebrile.  There is no drainage from the surgical sites. I discussed with her the use of "air" to inflate the abdominal cavity during the surgery and that is most likely the reason for her should and some of the abdominal pain. I encouraged her to alternate tylenol/motrin/oxycodone.  I also told her to use a heating pad for the pain.  I encouraged her to increase her activity, increase fluid intake, keep diet bland.  She did not take senokot-s last night.  I instructed her to take 2 senokots this am and again tonight. She was instructed to call with any questions or concerns. She verbalized understanding.

## 2019-04-27 ENCOUNTER — Telehealth: Payer: Self-pay

## 2019-04-27 LAB — SURGICAL PATHOLOGY

## 2019-04-27 NOTE — Telephone Encounter (Signed)
Told Ms Reichelt that the pathology showed no cancer per Joylene John, NP. Ms Kijek 's pain level is currently a 9/10. She took her ibuprofen 600 mg ~1 pm today. She has oly used 1 Oxycodone for pain since surgery. Recommended that she take 1-2  500 mg tab of tylenol alt. With Ibuprofen ~3 hrs after ibuprofen.  She can take 1/2-1 of the Oxycodone as needed, Especially if pain level is 6-10/10. Her abdomen is distended.  She has had 2 small BM. Suggested that she take the senolot -S as directed as she only used once. She can take 1 capful Miralax bid in addition to the Senokot-S. She can increase diet especially with fruits and veggatabes. She has bee eating some soup and gingerale.  Told her to increase her fluid intake to 64 oz of water in 24 hours. Pt verbalized understanding.

## 2019-04-30 ENCOUNTER — Telehealth: Payer: Self-pay

## 2019-04-30 NOTE — Telephone Encounter (Deleted)
Carrie Murillo

## 2019-04-30 NOTE — Telephone Encounter (Addendum)
Ms Carrie Murillo called to see if her pain should still be at a 9/10 in her abdomen. Ms Carrie Murillo Pain was a 9/10 on 04-27-19 and reviewed taking Oxycodone 5 mg  1/2 to 1 tablet q 4 hrs prn Taking Ibuprofen 600 mg q 6 hrs alt. with tylenol 500 mg tabs. 1-2 tabs q hrs. She has not used any Oxycodone and taken the Ibuprofen  Intermittently.  She vomited last evening after taking an ibuprofen.  She feels it was from not eating vs. Ileus. She has taken senokot -S as directed she has has a couple of BM.  She had diarrhea with vomiting last evening. Afebrile. No shaking chills. She feels that she has not eaten enough to take Oxycodone. Told her that she can take with crackers/yougart, ice cream. Reviewed with Joylene John, NP. Will encourage patient to get pain under control and see if vomiting and abdominal pain continues. Suggested that she eat toast, soups, yogurt, foods that are easier to digest and push fluids for the next 24 hours.  If she continues with N/V with severe abdominal pain with the pain medications, She needs to call the office at [phone].  Had Ms Carrie Murillo take an Oxycodone at 1245 pm. Her pain level is a 6/10. Now ~ 2 hrs later. Reviewed the pain medication schedule as noted above. Pt verbalized understanding.

## 2019-05-03 LAB — CYTOLOGY - NON PAP

## 2019-05-04 ENCOUNTER — Telehealth: Payer: Self-pay

## 2019-05-04 NOTE — Telephone Encounter (Signed)
Told Carrie Murillo that the pathology and cytology showed no cancer. Carrie Murillo stated that she is feeling better. Abdominal pain decreasing.  She is only adding a half of 5 mg Oxycodone if needed. Continuing with Ibuprofen alt with Tylenol. Pt moving bowels.  No N/V She feels a little lightheaded at times. She is not taking in much water and previously discussed. Encouraged her to take in at least 64 oz daily.  Told her that the Oxycodone can make her feel foggy and lightheaded as well. Shggested that she get up from lying and sitting slowly to get equilibrium. Pt verbalized understanding.

## 2019-05-16 ENCOUNTER — Other Ambulatory Visit: Payer: Self-pay

## 2019-05-16 ENCOUNTER — Inpatient Hospital Stay: Payer: BC Managed Care – PPO | Attending: Gynecologic Oncology | Admitting: Gynecologic Oncology

## 2019-05-16 ENCOUNTER — Encounter: Payer: Self-pay | Admitting: Gynecologic Oncology

## 2019-05-16 VITALS — BP 116/86 | HR 66 | Temp 98.3°F | Resp 16 | Ht 63.0 in | Wt 158.0 lb

## 2019-05-16 DIAGNOSIS — Z79899 Other long term (current) drug therapy: Secondary | ICD-10-CM | POA: Diagnosis not present

## 2019-05-16 DIAGNOSIS — N838 Other noninflammatory disorders of ovary, fallopian tube and broad ligament: Secondary | ICD-10-CM

## 2019-05-16 DIAGNOSIS — G8918 Other acute postprocedural pain: Secondary | ICD-10-CM | POA: Diagnosis not present

## 2019-05-16 DIAGNOSIS — I10 Essential (primary) hypertension: Secondary | ICD-10-CM | POA: Insufficient documentation

## 2019-05-16 DIAGNOSIS — R971 Elevated cancer antigen 125 [CA 125]: Secondary | ICD-10-CM | POA: Diagnosis not present

## 2019-05-16 DIAGNOSIS — E663 Overweight: Secondary | ICD-10-CM | POA: Diagnosis not present

## 2019-05-16 DIAGNOSIS — Z90722 Acquired absence of ovaries, bilateral: Secondary | ICD-10-CM | POA: Diagnosis not present

## 2019-05-16 DIAGNOSIS — Z6829 Body mass index (BMI) 29.0-29.9, adult: Secondary | ICD-10-CM | POA: Diagnosis not present

## 2019-05-16 DIAGNOSIS — Z90710 Acquired absence of both cervix and uterus: Secondary | ICD-10-CM | POA: Diagnosis not present

## 2019-05-16 DIAGNOSIS — N83201 Unspecified ovarian cyst, right side: Secondary | ICD-10-CM | POA: Diagnosis present

## 2019-05-16 DIAGNOSIS — N83202 Unspecified ovarian cyst, left side: Secondary | ICD-10-CM | POA: Insufficient documentation

## 2019-05-16 DIAGNOSIS — N736 Female pelvic peritoneal adhesions (postinfective): Secondary | ICD-10-CM | POA: Insufficient documentation

## 2019-05-16 MED ORDER — AMOXICILLIN-POT CLAVULANATE 875-125 MG PO TABS: 1.0000 | ORAL_TABLET | Freq: Two times a day (BID) | ORAL | 0 refills | Status: AC

## 2019-05-16 NOTE — Progress Notes (Signed)
Follow-up Note: Gyn-Onc  Consult was initially requested by Dr. Landry Mellow for the evaluation of Crystalina F Isa Rankin 58 y.o. female  CC:  Chief Complaint  Patient presents with  . Pelvic adhesive disease  . Ovarian mass, right    Assessment/Plan:  Ms. MINTIE WITHERINGTON  is a 58 y.o.  year old who is 3 weeks s/p robotic assisted total hysterectomy >250gm, BSO, lysis of adhesions for stage IV endometriosis, fibroids and a complex right ovarian mass. Final pathology revealed a benign adenofibroma and benign endometrium. Her surgery was complicated due to the radical dissection necessary.  She has greater than expected pain and cuff tenderness postop. I am empirically prescribing augmentin for presumed cuff cellulitis and will obtain a CT abd/pelvis to rule out pelvic fluid collection requiring drainage.   If normal, she will follow-up with Dr Landry Mellow for long term gynecologic care.  HPI: Ms. Amyia Lodwick is a 58 year old P0 who is seen in consultation at the request of Dr. Landry Mellow for evaluation of a right complex ovarian mass, elevated CA-125, postmenopausal bleeding, fibroid uterus.  The patient reported a remote history of a fibroid uterus for which she underwent an abdominal myomectomy via low transverse incision in 2000.  She subsequently underwent what she describes as laser surgery through her groin in 2006 with Dr. Garwin Brothers at [facility].  There is no operative note from this procedure available in epic.  It is unclear if she meant to hysteroscopic procedure or uterine artery embolization.  She reports that she became amenorrheic after this procedure and therefore it may have also included endometrial ablation.  She developed hot flashes a few years after this procedure but had been amenorrheic immediately after that procedure.  Beginning in January 2020 she began having intermittent vaginal spotting.  She was seen and evaluated by Dr. Landry Mellow who performed a transvaginal ultrasound scan on April 03, 2019.  This revealed a fibroid uterus measuring 11.7 x 8.7 x 6.5 cm.  The endometrium could not be clearly discerned due to shadowing from the calcified fibroids.  The left ovary was normal in appearance and measured 1.3 x 2.2 x 1.5 cm.  The right ovary contained a complex mass with low-level internal echoes and measured 11.5 x 7.6 x 6.8 cm.  No blood flow was noted.  A Ca1 25 was drawn on April 04, 2019 and was elevated at 249.  Due to her postmenopausal bleeding symptoms she underwent an office endometrial biopsy and resection of a cervical polyp on April 03, 2019.  Both were benign with the endometrial biopsy revealing necrotic tissue in the cervical polyp being a benign cervical polyp.  A Pap smear had been performed on March 27, 2018 and had normal cytology with no high risk HPV detected.  The patient has a strong family history for breast cancer and had genetic testing performed in November 2019.  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   Her medical history is significant for hypertension and overweight (BMI 29 kg per metered square).  Surgical history significant for an abdominal myomectomy, colonoscopy, hemorrhoidectomy, and cataract surgery.    Family history is significant for a mother with a history of breast cancer maternal aunt with history of breast cancer.  The patient works at [employer] and lives with her husband.  She has never been pregnant and denies a history of abnormal Pap test.  Her most significant history is of symptomatic  uterine fibroids.  Interval Hx:  On April 24, 2019 she underwent a robotic assisted total hysterectomy for uterus greater than 250 g with BSO and lysis of adhesions.  Intraoperative findings were significant for a bulky 14 cm fibroid uterus with calcified fibroids densely adherent to the anterior cul-de-sac and posterior cul-de-sac which was  obliterated with sigmoid colon densely adherent to the right ovarian smooth-walled cystic mass which measured approximately 11 cm.  The mass was densely adherent to the right ovarian fossa sigmoid colon including the bowel wall and posterior uterus.  The right ureter was also densely adherent to the mass.  There were dense adhesions on the left between sigmoid colon mesentery and the left tube and ovary.  The dense pelvic adhesions obliterated both anterior and posterior cul-de-sacs consistent with stage IV endometriosis.  Frozen section of the ovary was benign.  Final pathology confirmed a uterus with fibroids, adenomyosis, and disordered proliferative endometrium.  The cervix was benign.  The left ovary contained a simple serous cyst.  The right ovary contained a simple serous cyst and an adenofibroma.  Foreign material was also identified and fibrous adhesions.  Postoperatively overall she did well however she did experience worse pain than anticipated and at week 3 postop was still experiencing significant intermittent pelvic pains, fatigue.  She denies bowel or bladder changes.  She denies fever.  She denies vaginal bleeding or drainage.  Current Meds:  Outpatient Encounter Medications as of 05/16/2019  Medication Sig  . amLODipine (NORVASC) 10 MG tablet Take 10 mg by mouth daily.  . bimatoprost (LUMIGAN) 0.03 % ophthalmic solution Place 1 drop into both eyes at bedtime.  Marland Kitchen oxyCODONE (OXY IR/ROXICODONE) 5 MG immediate release tablet Take 1 tablet (5 mg total) by mouth every 4 (four) hours as needed for severe pain. For AFTER surgery only, do not take and drive  . senna-docusate (SENOKOT-S) 8.6-50 MG tablet Take 2 tablets by mouth at bedtime. For AFTER surgery, do not take if having diarrhea  . valsartan-hydrochlorothiazide (DIOVAN-HCT) 160-12.5 MG tablet Take 1 tablet by mouth daily.  Marland Kitchen amoxicillin-clavulanate (AUGMENTIN) 875-125 MG tablet Take 1 tablet by mouth 2 (two) times daily.  Marland Kitchen ibuprofen  (ADVIL) 600 MG tablet Take 1 tablet (600 mg total) by mouth every 6 (six) hours as needed for moderate pain. For AFTER surgery (Patient not taking: Reported on 05/16/2019)   No facility-administered encounter medications on file as of 05/16/2019.     Allergy: No Known Allergies  Social Hx:   Social History   Socioeconomic History  . Marital status: Significant Other    Spouse name: Not on file  . Number of children: Not on file  . Years of education: Not on file  . Highest education level: Not on file  Occupational History  . Not on file  Social Needs  . Financial resource strain: Not on file  . Food insecurity    Worry: Not on file    Inability: Not on file  . Transportation needs    Medical: Not on file    Non-medical: Not on file  Tobacco Use  . Smoking status: Never Smoker  . Smokeless tobacco: Never Used  Substance and Sexual Activity  . Alcohol use: No  . Drug use: No  . Sexual activity: Not on file  Lifestyle  . Physical activity    Days per week: Not on file    Minutes per session: Not on file  . Stress: Not on file  Relationships  . Social connections  Talks on phone: Not on file    Gets together: Not on file    Attends religious service: Not on file    Active member of club or organization: Not on file    Attends meetings of clubs or organizations: Not on file    Relationship status: Not on file  . Intimate partner violence    Fear of current or ex partner: Not on file    Emotionally abused: Not on file    Physically abused: Not on file    Forced sexual activity: Not on file  Other Topics Concern  . Not on file  Social History Narrative  . Not on file    Past Surgical Hx:  Past Surgical History:  Procedure Laterality Date  . EYE SURGERY     cataracts as a child  . HEMORROIDECTOMY  1970s  . ROBOTIC ASSISTED TOTAL HYSTERECTOMY WITH BILATERAL SALPINGO OOPHERECTOMY N/A 04/24/2019   Procedure: XI ROBOTIC ASSISTED TOTAL HYSTERECTOMY greater than  250g, WITH BILATERAL SALPINGO OOPHORECTOMY, LYSIS OF ADHESIONS;  Surgeon: Everitt Amber, MD;  Location: [facility];  Service: Gynecology;  Laterality: N/A;  . UTERINE FIBROID SURGERY  200,2010    Past Medical Hx:  Past Medical History:  Diagnosis Date  . Dysrhythmia    pt feels heart flutter 2-3 times a week  . Family history of breast cancer   . Family history of colon cancer   . Family history of genetic disease carrier   . Hypertension     Past Gynecological History:  See HPI No LMP recorded. Patient is postmenopausal.  Family Hx:  Family History  Problem Relation Age of Onset  . Breast cancer Mother 43       dx late 09/19/2022, died at 31  . Breast cancer Maternal Aunt 28       died at 2  . Breast cancer Cousin 54  . Colon cancer Paternal Aunt        dx over 93  . Colon cancer Paternal Uncle        dx over 23  . Other Other        genetic test positive for a mutation- patient does not know which mutation  . Cancer Maternal Aunt        5 other maternal aunts all with cancer- types unk    Review of Systems:  Constitutional  Feels well,    ENT Normal appearing ears and nares bilaterally Skin/Breast  No rash, sores, jaundice, itching, dryness Cardiovascular  No chest pain, shortness of breath, or edema  Pulmonary  No cough or wheeze.  Gastro Intestinal  No nausea, vomitting, or diarrhoea. No bright red blood per rectum, no abdominal pain, change in bowel movement, or constipation.  Genito Urinary  No frequency, urgency, dysuria, + pain Musculo Skeletal  No myalgia, arthralgia, joint swelling or pain  Neurologic  No weakness, numbness, change in gait,  Psychology  No depression, anxiety, insomnia.   Vitals:  Blood pressure 116/86, pulse 66, temperature 98.3 F (36.8 C), temperature source Temporal, resp. rate 16, height 5' 3"  (1.6 m), weight 158 lb (71.7 kg), SpO2 100 %.  Physical Exam: WD in NAD Neck  Supple NROM, without any enlargements.  Lymph Node Survey No  cervical supraclavicular or inguinal adenopathy Cardiovascular  Pulse normal rate, regularity and rhythm. S1 and S2 normal.  Lungs  Clear to auscultation bilateraly, without wheezes/crackles/rhonchi. Good air movement.  Skin  No rash/lesions/breakdown  Psychiatry  Alert and oriented to person, place, and  time  Abdomen  Normoactive bowel sounds, abdomen soft, non-tender and overweight without evidence of hernia. Well healed incisions without evidence of cellulitis or hernia.  Back No CVA tenderness Genito Urinary  Vaginal cuff tender to palpate and firm without an obvious discrete pelvic mass. Rectal  No mass or fullness appreciated anteriorally  Extremities  No bilateral cyanosis, clubbing or edema.   Thereasa Solo, MD  05/16/2019, 3:55 PM

## 2019-05-16 NOTE — Patient Instructions (Signed)
Dr Denman George suspects that some of your increased pain might be due to an infection where the internal stitches are. She has ordered 2 weeks of antibiotics (take a tablet in the morning and night until all 28 tablets are taken). These can sometimes make you nauseated. Take with food.  She has ordered a CT scan to rule out a collection of fluid deep inside you that might require drainage to make you feel better.  If the scan is normal, no further treatment is needed at this time.  Dr Serita Grit office can be reached at [phone]. Please contact her office if your symptoms are getting worse.

## 2019-05-21 ENCOUNTER — Ambulatory Visit (HOSPITAL_COMMUNITY)
Admission: RE | Admit: 2019-05-21 | Discharge: 2019-05-21 | Disposition: A | Discharge status: Home / Self Care | Payer: BC Managed Care – PPO | Source: Ambulatory Visit | Attending: Gynecologic Oncology | Admitting: Gynecologic Oncology

## 2019-05-21 ENCOUNTER — Other Ambulatory Visit: Payer: Self-pay

## 2019-05-21 DIAGNOSIS — N838 Other noninflammatory disorders of ovary, fallopian tube and broad ligament: Secondary | ICD-10-CM | POA: Insufficient documentation

## 2019-05-21 DIAGNOSIS — G8918 Other acute postprocedural pain: Secondary | ICD-10-CM | POA: Diagnosis present

## 2019-05-21 MED ORDER — IOHEXOL 300 MG/ML  SOLN
100.0000 mL | Freq: Once | INTRAMUSCULAR | Status: AC | PRN
  Administered 2019-05-21: 100 mL via INTRAVENOUS

## 2019-05-21 MED ORDER — SODIUM CHLORIDE (PF) 0.9 % IJ SOLN
INTRAMUSCULAR | Status: AC
  Filled 2019-05-21: qty 50

## 2019-05-22 ENCOUNTER — Telehealth: Payer: Self-pay

## 2019-05-22 NOTE — Telephone Encounter (Signed)
LM for Carrie Murillo to call the office back to discuss the results of her CT scan from 05-21-19.

## 2019-05-23 NOTE — Telephone Encounter (Signed)
Carrie Murillo called.  I told her the ct scan was normal, showing post op changes.  She has not started the augmentin yet. She states there is no change in her discomfort. I encouraged her to do so.  She was concerned that she maybe going back to work too soon on 06/03/2019.  I reviewed this with Joylene John, NP.  She stated she should be good to return to work at that time.  Carrie Murillo verbalized understanding.

## 2019-05-28 ENCOUNTER — Other Ambulatory Visit: Payer: Self-pay | Admitting: Family Medicine

## 2019-05-28 DIAGNOSIS — Z1231 Encounter for screening mammogram for malignant neoplasm of breast: Secondary | ICD-10-CM

## 2019-06-14 ENCOUNTER — Encounter: Payer: Self-pay | Admitting: Gynecologic Oncology

## 2019-06-14 NOTE — Telephone Encounter (Signed)
Spoke with Patient and told her that Dr. Denman George can write a letter allowing her to wear loose fitting pants to work. Pt stated that her return has been extended to 06-23-19. Told patient that Dr. Denman George will not write a letter extending her leave. She was  medically able to return to work on 06-06-19. Pt will call her work to see what she can do.  She does not need a letter at this time. She will call back if she needs a letter for employer.

## 2019-06-15 ENCOUNTER — Encounter: Payer: Self-pay | Admitting: Gynecologic Oncology

## 2019-06-19 ENCOUNTER — Telehealth: Payer: Self-pay

## 2019-06-19 NOTE — Telephone Encounter (Signed)
Faxed letter to her employer/[employer] to be able to return to work on 06-16-19 with less restrictive clothing as not to irritate abdominal incisions.   Pt stated that she went back to work 06-18-19. Kept the letter dated for 06-16-19. Pt aware.

## 2019-07-02 ENCOUNTER — Other Ambulatory Visit: Payer: BC Managed Care – PPO

## 2019-08-09 ENCOUNTER — Other Ambulatory Visit: Payer: Self-pay

## 2019-08-09 ENCOUNTER — Ambulatory Visit
Admission: RE | Admit: 2019-08-09 | Discharge: 2019-08-09 | Disposition: A | Discharge status: Home / Self Care | Payer: BC Managed Care – PPO | Source: Ambulatory Visit | Attending: Family Medicine | Admitting: Family Medicine

## 2019-08-09 DIAGNOSIS — Z1231 Encounter for screening mammogram for malignant neoplasm of breast: Secondary | ICD-10-CM

## 2020-05-26 ENCOUNTER — Other Ambulatory Visit: Payer: Self-pay | Admitting: Family Medicine

## 2020-05-26 DIAGNOSIS — R519 Headache, unspecified: Secondary | ICD-10-CM

## 2020-06-13 ENCOUNTER — Ambulatory Visit
Admission: RE | Admit: 2020-06-13 | Discharge: 2020-06-13 | Disposition: A | Discharge status: Home / Self Care | Payer: BC Managed Care – PPO | Source: Ambulatory Visit | Attending: Family Medicine | Admitting: Family Medicine

## 2020-06-13 DIAGNOSIS — R519 Headache, unspecified: Secondary | ICD-10-CM

## 2020-06-13 MED ORDER — IOPAMIDOL (ISOVUE-300) INJECTION 61%
75.0000 mL | Freq: Once | INTRAVENOUS | Status: AC | PRN
  Administered 2020-06-13: 75 mL via INTRAVENOUS

## 2020-07-16 ENCOUNTER — Other Ambulatory Visit: Payer: Self-pay | Admitting: Family Medicine

## 2020-07-16 DIAGNOSIS — Z1231 Encounter for screening mammogram for malignant neoplasm of breast: Secondary | ICD-10-CM

## 2020-08-27 ENCOUNTER — Ambulatory Visit: Payer: BC Managed Care – PPO

## 2020-08-28 ENCOUNTER — Ambulatory Visit
Admission: RE | Admit: 2020-08-28 | Discharge: 2020-08-28 | Disposition: A | Discharge status: Home / Self Care | Payer: BC Managed Care – PPO | Source: Ambulatory Visit | Attending: Family Medicine | Admitting: Family Medicine

## 2020-08-28 ENCOUNTER — Other Ambulatory Visit: Payer: Self-pay

## 2020-08-28 DIAGNOSIS — Z1231 Encounter for screening mammogram for malignant neoplasm of breast: Secondary | ICD-10-CM

## 2020-09-16 ENCOUNTER — Other Ambulatory Visit: Payer: Self-pay | Admitting: Plastic Surgery

## 2021-02-12 ENCOUNTER — Other Ambulatory Visit: Payer: Self-pay | Admitting: Family Medicine

## 2021-02-12 DIAGNOSIS — I498 Other specified cardiac arrhythmias: Secondary | ICD-10-CM

## 2021-02-18 ENCOUNTER — Inpatient Hospital Stay: Payer: BC Managed Care – PPO

## 2021-02-18 ENCOUNTER — Other Ambulatory Visit: Payer: Self-pay

## 2021-02-18 DIAGNOSIS — I498 Other specified cardiac arrhythmias: Secondary | ICD-10-CM

## 2021-07-22 ENCOUNTER — Other Ambulatory Visit: Payer: Self-pay | Admitting: Family Medicine

## 2021-08-03 ENCOUNTER — Other Ambulatory Visit: Payer: Self-pay | Admitting: Family Medicine

## 2021-08-03 DIAGNOSIS — Z1231 Encounter for screening mammogram for malignant neoplasm of breast: Secondary | ICD-10-CM

## 2021-08-31 ENCOUNTER — Other Ambulatory Visit: Payer: Self-pay | Admitting: Physician Assistant

## 2021-08-31 DIAGNOSIS — J302 Other seasonal allergic rhinitis: Secondary | ICD-10-CM

## 2021-09-02 ENCOUNTER — Ambulatory Visit
Admission: RE | Admit: 2021-09-02 | Discharge: 2021-09-02 | Disposition: A | Discharge status: Home / Self Care | Payer: BC Managed Care – PPO | Source: Ambulatory Visit | Attending: Family Medicine | Admitting: Family Medicine

## 2021-09-02 ENCOUNTER — Other Ambulatory Visit: Payer: Self-pay

## 2021-09-02 DIAGNOSIS — Z1231 Encounter for screening mammogram for malignant neoplasm of breast: Secondary | ICD-10-CM

## 2021-09-03 ENCOUNTER — Other Ambulatory Visit: Payer: Self-pay | Admitting: Family Medicine

## 2021-09-03 DIAGNOSIS — R928 Other abnormal and inconclusive findings on diagnostic imaging of breast: Secondary | ICD-10-CM

## 2021-09-24 ENCOUNTER — Other Ambulatory Visit: Payer: Self-pay | Admitting: Family Medicine

## 2021-09-24 ENCOUNTER — Ambulatory Visit
Admission: RE | Admit: 2021-09-24 | Discharge: 2021-09-24 | Disposition: A | Discharge status: Home / Self Care | Payer: BC Managed Care – PPO | Source: Ambulatory Visit | Attending: Family Medicine | Admitting: Family Medicine

## 2021-09-24 DIAGNOSIS — N6489 Other specified disorders of breast: Secondary | ICD-10-CM

## 2021-09-24 DIAGNOSIS — R928 Other abnormal and inconclusive findings on diagnostic imaging of breast: Secondary | ICD-10-CM

## 2021-09-24 DIAGNOSIS — R921 Mammographic calcification found on diagnostic imaging of breast: Secondary | ICD-10-CM

## 2021-10-18 ENCOUNTER — Emergency Department (HOSPITAL_BASED_OUTPATIENT_CLINIC_OR_DEPARTMENT_OTHER): Payer: BC Managed Care – PPO

## 2021-10-18 ENCOUNTER — Other Ambulatory Visit: Payer: Self-pay

## 2021-10-18 ENCOUNTER — Emergency Department (HOSPITAL_BASED_OUTPATIENT_CLINIC_OR_DEPARTMENT_OTHER)
Admission: EM | Admit: 2021-10-18 | Discharge: 2021-10-18 | Disposition: A | Discharge status: Home / Self Care | Payer: BC Managed Care – PPO | Attending: Emergency Medicine | Admitting: Emergency Medicine

## 2021-10-18 ENCOUNTER — Encounter (HOSPITAL_BASED_OUTPATIENT_CLINIC_OR_DEPARTMENT_OTHER): Payer: Self-pay | Admitting: Emergency Medicine

## 2021-10-18 DIAGNOSIS — R059 Cough, unspecified: Secondary | ICD-10-CM | POA: Diagnosis not present

## 2021-10-18 DIAGNOSIS — R0789 Other chest pain: Secondary | ICD-10-CM | POA: Diagnosis not present

## 2021-10-18 DIAGNOSIS — R12 Heartburn: Secondary | ICD-10-CM | POA: Diagnosis not present

## 2021-10-18 DIAGNOSIS — R0981 Nasal congestion: Secondary | ICD-10-CM | POA: Diagnosis not present

## 2021-10-18 DIAGNOSIS — R079 Chest pain, unspecified: Secondary | ICD-10-CM | POA: Diagnosis present

## 2021-10-18 LAB — BASIC METABOLIC PANEL
Anion gap: 7 (ref 5–15)
BUN: 18 mg/dL (ref 6–20)
CO2: 27 mmol/L (ref 22–32)
Calcium: 8.9 mg/dL (ref 8.9–10.3)
Chloride: 106 mmol/L (ref 98–111)
Creatinine, Ser: 0.88 mg/dL (ref 0.44–1.00)
GFR, Estimated: >60 mL/min (ref >60)
Glucose, Bld: 100 mg/dL — ABNORMAL HIGH (ref 70–99)
Potassium: 3.4 mmol/L — ABNORMAL LOW (ref 3.5–5.1)
Sodium: 140 mmol/L (ref 135–145)

## 2021-10-18 LAB — CBC
HCT: 38.9 % (ref 36.0–46.0)
Hemoglobin: 13.7 g/dL (ref 12.0–15.0)
MCH: 29 pg (ref 26.0–34.0)
MCHC: 35.2 g/dL (ref 30.0–36.0)
MCV: 82.4 fL (ref 80.0–100.0)
Platelets: 269 10*3/uL (ref 150–400)
RBC: 4.72 MIL/uL (ref 3.87–5.11)
RDW: 12.5 % (ref 11.5–15.5)
WBC: 5.4 10*3/uL (ref 4.0–10.5)
nRBC: 0 % (ref 0.0–0.2)

## 2021-10-18 LAB — TROPONIN I (HIGH SENSITIVITY)
Troponin I (High Sensitivity): <2 ng/L (ref <18)
Troponin I (High Sensitivity): 2 ng/L (ref <18)

## 2021-10-18 MED ORDER — OMEPRAZOLE 20 MG PO CPDR: 20.0000 mg | DELAYED_RELEASE_CAPSULE | Freq: Every day | ORAL | 0 refills | Status: AC

## 2021-10-18 NOTE — ED Provider Notes (Signed)
?[facility] ?Provider Note ? ? ?CSN: [id number] ?Arrival date & time: 10/18/21  1426 ? ?  ? ?History ? ?Chief Complaint  ?Patient presents with  ? Chest Pain  ? ? ?Carrie Murillo is a 61 y.o. female. ? ?HPI ?Patient reports for the last couple of days she has been getting some occasional sharp central chest pains.  They come on pretty quickly and resolved quickly.  She feels them right between her breasts in the center of the chest.  He denies she is getting any associated symptoms.  No nausea, no weakness, no lightheadedness or shortness of breath.  She reports they do not seem to be particularly triggered by anything.  They might happen while she is doing physical activity or they may happen at rest.  No lower extremity swelling or calf pain.  No recent fever or chills.  Patient reports she frequently has sinus congestion and postnasal drainage.  She reports that makes her cough a little bit but she has not had any fever chills or productive cough.  She has been taking Zyrtec regularly.  Patient reports she does have heartburn occasionally and takes Tums but does not take any medication regularly for reflux. ? ?Patient is a non-smoker.  She denies family history of coronary artery disease or early sudden death.  She reports most people in her family die of cancer. ? ?Patient reports she wore a 2-week heart monitor last summer.  She denies previously having a stress test ?  ? ?Home Medications ?Prior to Admission medications   ?Medication Sig Start Date End Date Taking? Authorizing Provider  ?amLODipine (NORVASC) 10 MG tablet Take 10 mg by mouth daily.   Yes [provider]  ?amoxicillin-clavulanate (AUGMENTIN) 875-125 MG tablet Take 1 tablet by mouth 2 (two) times daily. 05/16/19  Yes Everitt Amber, MD  ?bimatoprost (LUMIGAN) 0.03 % ophthalmic solution Place 1 drop into both eyes at bedtime.   Yes [provider]  ?ibuprofen (ADVIL) 600 MG tablet Take 1 tablet (600 mg  total) by mouth every 6 (six) hours as needed for moderate pain. For AFTER surgery 04/16/19  Yes Cross, Lenna Sciara D, NP  ?omeprazole (PRILOSEC) 20 MG capsule Take 1 capsule (20 mg total) by mouth daily. 10/18/21  Yes Charlesetta Shanks, MD  ?oxyCODONE (OXY IR/ROXICODONE) 5 MG immediate release tablet Take 1 tablet (5 mg total) by mouth every 4 (four) hours as needed for severe pain. For AFTER surgery only, do not take and drive 37/62/83  Yes Cross, Melissa D, NP  ?senna-docusate (SENOKOT-S) 8.6-50 MG tablet Take 2 tablets by mouth at bedtime. For AFTER surgery, do not take if having diarrhea 04/16/19  Yes Cross, Melissa D, NP  ?valsartan-hydrochlorothiazide (DIOVAN-HCT) 160-12.5 MG tablet Take 1 tablet by mouth daily.   Yes [provider]  ?   ? ?Allergies    ?Patient has no known allergies.   ? ?Review of Systems   ?Review of Systems ?10 systems reviewed negative except as per HPI ?Physical Exam ?Updated Vital Signs ?BP (!) 143/91   Pulse 63   Temp 97.7 ?F (36.5 ?C) (Oral)   Resp 15   Ht '5\' 3"'$  (1.6 m)   Wt 72.6 kg   SpO2 100%   BMI 28.34 kg/m?  ?Physical Exam ?Constitutional:   ?   Appearance: Normal appearance.  ?HENT:  ?   Head: Normocephalic and atraumatic.  ?   Mouth/Throat:  ?   Pharynx: Oropharynx is clear.  ?Eyes:  ?  Extraocular Movements: Extraocular movements intact.  ?   Conjunctiva/sclera: Conjunctivae normal.  ?   Pupils: Pupils are equal, round, and reactive to light.  ?Cardiovascular:  ?   Rate and Rhythm: Normal rate and regular rhythm.  ?   Heart sounds: Normal heart sounds.  ?Pulmonary:  ?   Effort: Pulmonary effort is normal.  ?   Breath sounds: Normal breath sounds.  ?Chest:  ?   Chest wall: No tenderness.  ?Abdominal:  ?   General: There is no distension.  ?   Palpations: Abdomen is soft.  ?   Tenderness: There is no abdominal tenderness. There is no guarding.  ?Musculoskeletal:     ?   General: No swelling or tenderness. Normal range of motion.  ?   Cervical back: Normal range of  motion and neck supple.  ?   Right lower leg: No edema.  ?   Left lower leg: No edema.  ?Skin: ?   General: Skin is warm and dry.  ?Neurological:  ?   General: No focal deficit present.  ?   Mental Status: She is alert and oriented to person, place, and time.  ?   Coordination: Coordination normal.  ?Psychiatric:     ?   Mood and Affect: Mood normal.  ? ? ?ED Results / Procedures / Treatments   ?Labs ?(all labs ordered are listed, but only abnormal results are displayed) ?Labs Reviewed  ?BASIC METABOLIC PANEL - Abnormal; Notable for the following components:  ?    Result Value  ? Potassium 3.4 (*)   ? Glucose, Bld 100 (*)   ? All other components within normal limits  ?CBC  ?TROPONIN I (HIGH SENSITIVITY)  ?TROPONIN I (HIGH SENSITIVITY)  ? ? ?EKG ?EKG Interpretation ? ?Date/Time:  Sunday October 18 2021 14:37:08 EDT ?Ventricular Rate:  63 ?PR Interval:  174 ?QRS Duration: 62 ?QT Interval:  390 ?QTC Calculation: 399 ?R Axis:   38 ?Text Interpretation: Normal sinus rhythm Low voltage QRS Septal infarct , age undetermined Abnormal ECG When compared with ECG of 19-Apr-2019 07:59, PREVIOUS ECG IS PRESENT no sig change from previous Confirmed by Charlesetta Shanks [id number]) on 10/18/2021 5:45:19 PM ? ?Radiology ?DG Chest 2 View ? ?Result Date: 10/18/2021 ?CLINICAL DATA:  Chest pain for 2 days. EXAM: CHEST - 2 VIEW COMPARISON:  October 10, 2008 FINDINGS: The heart size and mediastinal contours are within normal limits. Both lungs are clear. The visualized skeletal structures are unremarkable. IMPRESSION: No active cardiopulmonary disease. Electronically Signed   By: Abelardo Diesel M.D.   On: 10/18/2021 15:22   ? ?Procedures ?Procedures  ? ? ?Medications Ordered in ED ?Medications - No data to display ? ?ED Course/ Medical Decision Making/ A&P ?  ?                        ?Medical Decision Making ?Amount and/or Complexity of Data Reviewed ?Labs: ordered. ?Radiology: ordered. ? ? ?Patient is having isolated sharp chest pains.  No  associated symptoms.  Patient is clinically well.  Cardiac risk factors include medication controlled hypertension and age.  We will proceed with heart labs, EKG, chest x-ray.  Patient is not having any active chest pain vital signs are stable.  No medications indicated at this time. ? ?EKG reviewed by myself with no acute changes.  Patient's chest x-ray shows no abnormality.  This is reviewed by radiology. ? ?Vital signs are stable.  Diagnostic work-up within normal limits and heart  score 2.  At this time I do feel patient stable for continued outpatient evaluation.  My recommendation is for follow-up with cardiology for outpatient stress test.  I suggest patient take 2 weeks of omeprazole for possible reflux related symptoms. ? ? ? ? ? ? ? ?Final Clinical Impression(s) / ED Diagnoses ?Final diagnoses:  ?Atypical chest pain  ? ? ?Rx / DC Orders ?ED Discharge Orders   ? ?      Ordered  ?  omeprazole (PRILOSEC) 20 MG capsule  Daily       ? 10/18/21 1758  ? ?  ?  ? ?  ? ? ?  ?Charlesetta Shanks, MD ?10/18/21 1809 ? ?

## 2021-10-18 NOTE — Discharge Instructions (Addendum)
1.  Call your doctor tomorrow and let her know you are seen in the emergency department for chest pain.  I do recommend scheduling an outpatient stress test. ?2.  Start taking omeprazole daily for 2 weeks to see if symptoms improve.  Follow instructions for GERD per your discharge paperwork. ?3.  Return to the emergency department immediately if you get new or concerning symptoms such as feeling nauseated and sweaty, lightheadedness or short of breath, worsening or recurrent chest pain or other concerning symptoms. ?

## 2021-10-18 NOTE — ED Triage Notes (Signed)
Pt arrives pov, steady gait with c/o non radiating CP x 2 days. Pt  denies shob, endorses cough ?

## 2022-03-31 ENCOUNTER — Other Ambulatory Visit: Payer: BC Managed Care – PPO

## 2022-04-08 ENCOUNTER — Other Ambulatory Visit: Payer: Self-pay | Admitting: Family Medicine

## 2022-04-08 ENCOUNTER — Ambulatory Visit
Admission: RE | Admit: 2022-04-08 | Discharge: 2022-04-08 | Disposition: A | Discharge status: Home / Self Care | Payer: BC Managed Care – PPO | Source: Ambulatory Visit | Attending: Family Medicine | Admitting: Family Medicine

## 2022-04-08 ENCOUNTER — Ambulatory Visit: Payer: BC Managed Care – PPO

## 2022-04-08 DIAGNOSIS — N6489 Other specified disorders of breast: Secondary | ICD-10-CM

## 2022-04-08 DIAGNOSIS — R921 Mammographic calcification found on diagnostic imaging of breast: Secondary | ICD-10-CM

## 2022-05-14 IMAGING — DX DG CHEST 2V
2 series · 2 of 2 positions shown · non-contrast
Comparison: October 10, 2008

CLINICAL DATA: Chest pain for 2 days.

EXAM:
CHEST - 2 VIEW

[chest pa]
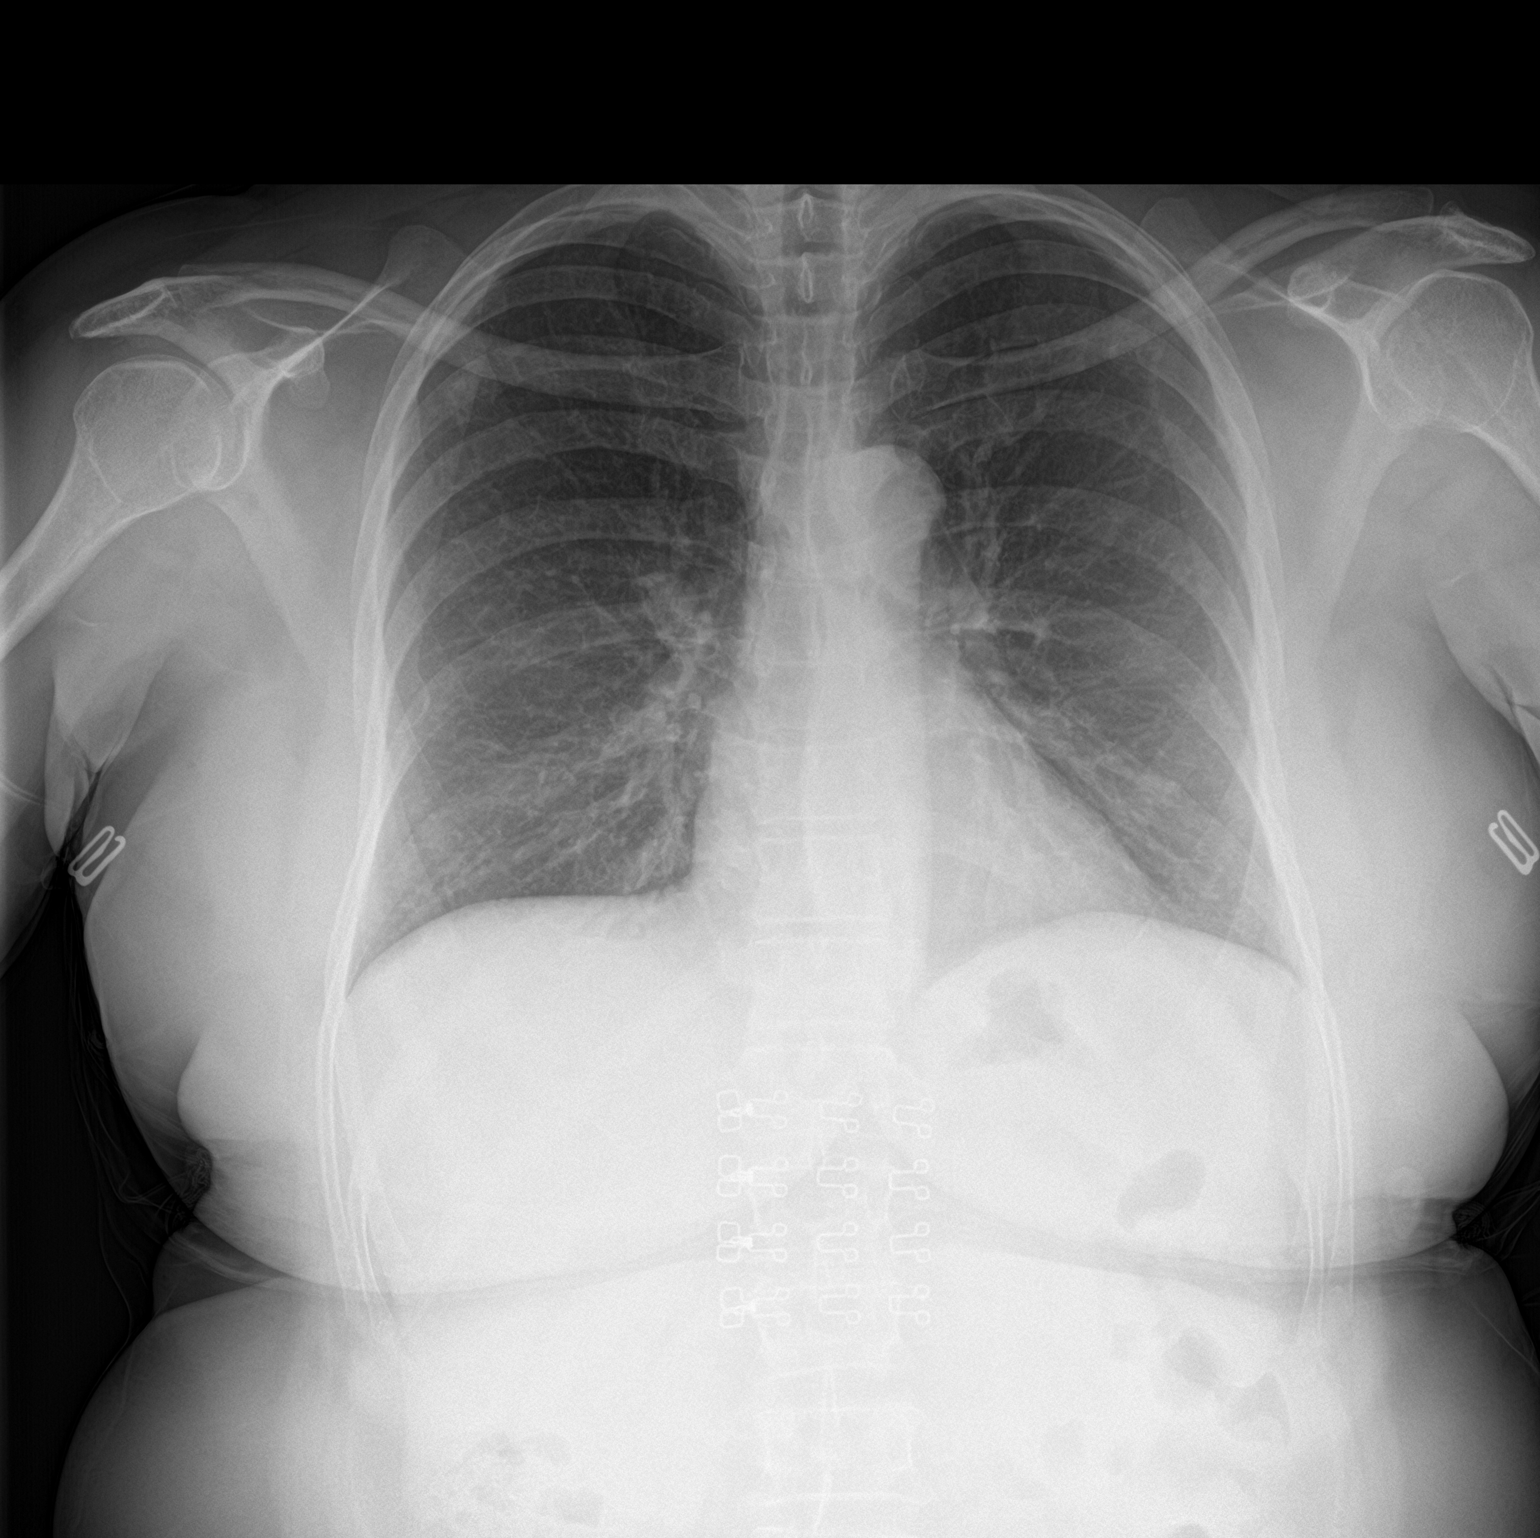

[chest lat]
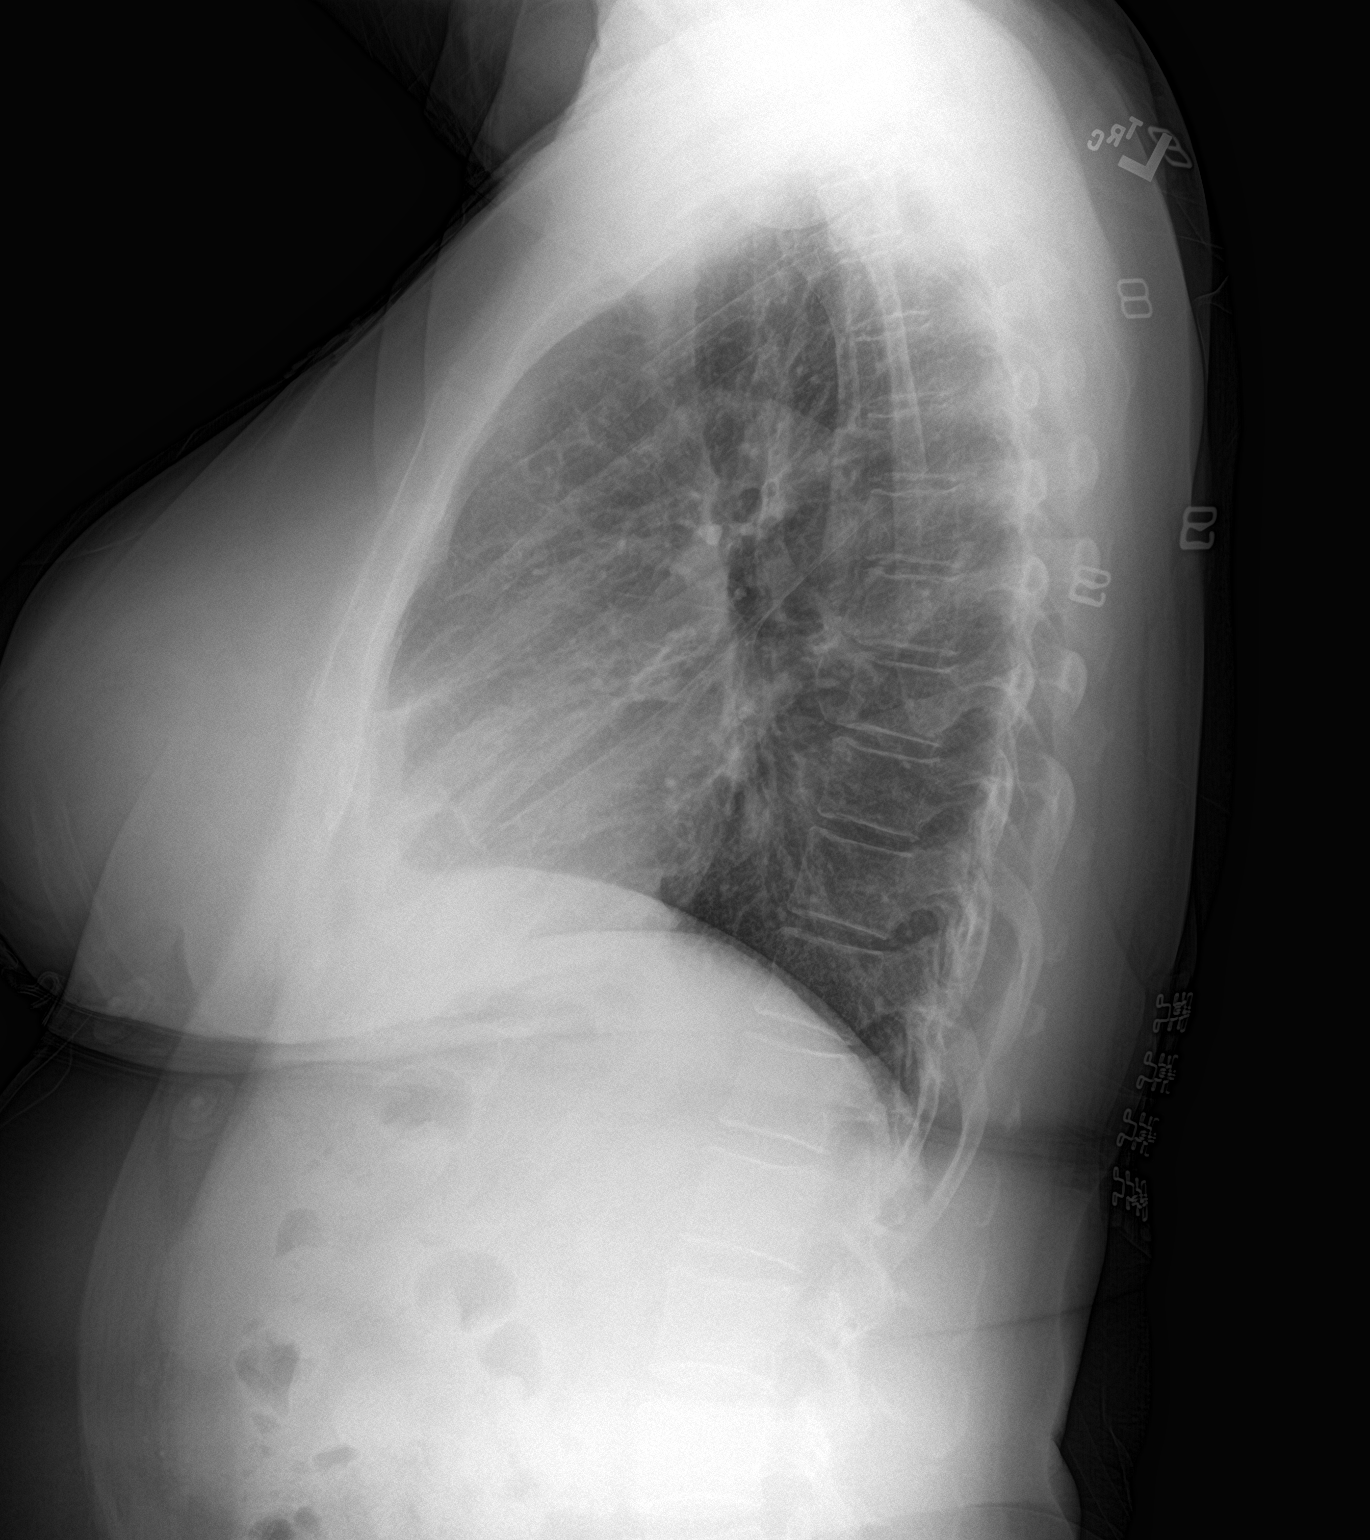

[2 of 2 positions shown; findings below may reference images not displayed]

FINDINGS: The heart size and mediastinal contours are within normal limits.
Both lungs are clear. The visualized skeletal structures are
unremarkable.
IMPRESSION: No active cardiopulmonary disease.

## 2022-05-26 DIAGNOSIS — Z803 Family history of malignant neoplasm of breast: Secondary | ICD-10-CM | POA: Diagnosis not present

## 2022-05-26 DIAGNOSIS — I10 Essential (primary) hypertension: Secondary | ICD-10-CM | POA: Diagnosis not present

## 2022-05-26 DIAGNOSIS — H409 Unspecified glaucoma: Secondary | ICD-10-CM | POA: Diagnosis not present

## 2022-05-26 DIAGNOSIS — M1990 Unspecified osteoarthritis, unspecified site: Secondary | ICD-10-CM | POA: Diagnosis not present

## 2022-05-26 DIAGNOSIS — Z8249 Family history of ischemic heart disease and other diseases of the circulatory system: Secondary | ICD-10-CM | POA: Diagnosis not present

## 2022-08-17 DIAGNOSIS — H4010X1 Unspecified open-angle glaucoma, mild stage: Secondary | ICD-10-CM | POA: Diagnosis not present

## 2022-08-17 DIAGNOSIS — H40053 Ocular hypertension, bilateral: Secondary | ICD-10-CM | POA: Diagnosis not present

## 2022-08-19 DIAGNOSIS — E88819 Insulin resistance, unspecified: Secondary | ICD-10-CM | POA: Diagnosis not present

## 2022-08-19 DIAGNOSIS — J301 Allergic rhinitis due to pollen: Secondary | ICD-10-CM | POA: Diagnosis not present

## 2022-08-19 DIAGNOSIS — I10 Essential (primary) hypertension: Secondary | ICD-10-CM | POA: Diagnosis not present

## 2022-09-23 DIAGNOSIS — E7800 Pure hypercholesterolemia, unspecified: Secondary | ICD-10-CM | POA: Diagnosis not present

## 2022-09-23 DIAGNOSIS — R7309 Other abnormal glucose: Secondary | ICD-10-CM | POA: Diagnosis not present

## 2022-09-23 DIAGNOSIS — Z6828 Body mass index (BMI) 28.0-28.9, adult: Secondary | ICD-10-CM | POA: Diagnosis not present

## 2022-09-23 DIAGNOSIS — R232 Flushing: Secondary | ICD-10-CM | POA: Diagnosis not present

## 2022-09-23 DIAGNOSIS — I10 Essential (primary) hypertension: Secondary | ICD-10-CM | POA: Diagnosis not present

## 2022-10-13 ENCOUNTER — Ambulatory Visit
Admission: RE | Admit: 2022-10-13 | Discharge: 2022-10-13 | Disposition: A | Discharge status: Home / Self Care | Payer: BC Managed Care – PPO | Source: Ambulatory Visit | Attending: Family Medicine | Admitting: Family Medicine

## 2022-10-13 DIAGNOSIS — N6489 Other specified disorders of breast: Secondary | ICD-10-CM

## 2022-10-20 DIAGNOSIS — H4010X1 Unspecified open-angle glaucoma, mild stage: Secondary | ICD-10-CM | POA: Diagnosis not present

## 2022-11-10 DIAGNOSIS — H409 Unspecified glaucoma: Secondary | ICD-10-CM | POA: Diagnosis not present

## 2022-11-10 DIAGNOSIS — I169 Hypertensive crisis, unspecified: Secondary | ICD-10-CM | POA: Diagnosis not present

## 2022-11-10 DIAGNOSIS — J302 Other seasonal allergic rhinitis: Secondary | ICD-10-CM | POA: Diagnosis not present

## 2022-11-10 DIAGNOSIS — N951 Menopausal and female climacteric states: Secondary | ICD-10-CM | POA: Diagnosis not present

## 2022-11-10 DIAGNOSIS — Z803 Family history of malignant neoplasm of breast: Secondary | ICD-10-CM | POA: Diagnosis not present

## 2022-11-10 DIAGNOSIS — J310 Chronic rhinitis: Secondary | ICD-10-CM | POA: Diagnosis not present

## 2022-11-10 DIAGNOSIS — I10 Essential (primary) hypertension: Secondary | ICD-10-CM | POA: Diagnosis not present

## 2022-11-10 DIAGNOSIS — Z8249 Family history of ischemic heart disease and other diseases of the circulatory system: Secondary | ICD-10-CM | POA: Diagnosis not present

## 2022-12-23 ENCOUNTER — Ambulatory Visit
Admission: RE | Admit: 2022-12-23 | Discharge: 2022-12-23 | Disposition: A | Discharge status: Home / Self Care | Payer: BC Managed Care – PPO | Source: Ambulatory Visit | Attending: Family Medicine | Admitting: Family Medicine

## 2022-12-23 ENCOUNTER — Other Ambulatory Visit: Payer: Self-pay | Admitting: Family Medicine

## 2022-12-23 DIAGNOSIS — E785 Hyperlipidemia, unspecified: Secondary | ICD-10-CM | POA: Diagnosis not present

## 2022-12-23 DIAGNOSIS — R519 Headache, unspecified: Secondary | ICD-10-CM

## 2022-12-23 DIAGNOSIS — M79604 Pain in right leg: Secondary | ICD-10-CM | POA: Diagnosis not present

## 2022-12-23 DIAGNOSIS — I10 Essential (primary) hypertension: Secondary | ICD-10-CM | POA: Diagnosis not present

## 2022-12-23 DIAGNOSIS — N951 Menopausal and female climacteric states: Secondary | ICD-10-CM | POA: Diagnosis not present

## 2023-01-05 DIAGNOSIS — H2703 Aphakia, bilateral: Secondary | ICD-10-CM | POA: Diagnosis not present

## 2023-01-05 DIAGNOSIS — H40053 Ocular hypertension, bilateral: Secondary | ICD-10-CM | POA: Diagnosis not present

## 2023-02-09 DIAGNOSIS — H40053 Ocular hypertension, bilateral: Secondary | ICD-10-CM | POA: Diagnosis not present

## 2023-02-09 DIAGNOSIS — H2703 Aphakia, bilateral: Secondary | ICD-10-CM | POA: Diagnosis not present

## 2023-02-10 DIAGNOSIS — F064 Anxiety disorder due to known physiological condition: Secondary | ICD-10-CM | POA: Diagnosis not present

## 2023-02-10 DIAGNOSIS — M130 Polyarthritis, unspecified: Secondary | ICD-10-CM | POA: Diagnosis not present

## 2023-02-10 DIAGNOSIS — R519 Headache, unspecified: Secondary | ICD-10-CM | POA: Diagnosis not present

## 2023-02-10 DIAGNOSIS — I10 Essential (primary) hypertension: Secondary | ICD-10-CM | POA: Diagnosis not present

## 2023-02-10 DIAGNOSIS — N62 Hypertrophy of breast: Secondary | ICD-10-CM | POA: Diagnosis not present

## 2023-02-10 DIAGNOSIS — J301 Allergic rhinitis due to pollen: Secondary | ICD-10-CM | POA: Diagnosis not present

## 2023-03-11 DIAGNOSIS — M79651 Pain in right thigh: Secondary | ICD-10-CM | POA: Diagnosis not present

## 2023-03-17 DIAGNOSIS — E785 Hyperlipidemia, unspecified: Secondary | ICD-10-CM | POA: Diagnosis not present

## 2023-03-17 DIAGNOSIS — F064 Anxiety disorder due to known physiological condition: Secondary | ICD-10-CM | POA: Diagnosis not present

## 2023-03-17 DIAGNOSIS — I10 Essential (primary) hypertension: Secondary | ICD-10-CM | POA: Diagnosis not present

## 2023-04-07 ENCOUNTER — Encounter: Payer: Self-pay | Admitting: Family Medicine

## 2023-04-13 DIAGNOSIS — M6281 Muscle weakness (generalized): Secondary | ICD-10-CM | POA: Diagnosis not present

## 2023-04-13 DIAGNOSIS — M79651 Pain in right thigh: Secondary | ICD-10-CM | POA: Diagnosis not present

## 2023-04-28 DIAGNOSIS — M79651 Pain in right thigh: Secondary | ICD-10-CM | POA: Diagnosis not present

## 2023-04-28 DIAGNOSIS — M6281 Muscle weakness (generalized): Secondary | ICD-10-CM | POA: Diagnosis not present

## 2023-05-04 ENCOUNTER — Encounter (HOSPITAL_COMMUNITY): Payer: Self-pay | Admitting: Family Medicine

## 2023-05-04 ENCOUNTER — Other Ambulatory Visit: Payer: Self-pay | Admitting: Family Medicine

## 2023-05-04 DIAGNOSIS — N644 Mastodynia: Secondary | ICD-10-CM

## 2023-05-25 DIAGNOSIS — M79651 Pain in right thigh: Secondary | ICD-10-CM | POA: Diagnosis not present

## 2023-05-25 DIAGNOSIS — M6281 Muscle weakness (generalized): Secondary | ICD-10-CM | POA: Diagnosis not present

## 2023-06-01 DIAGNOSIS — M79651 Pain in right thigh: Secondary | ICD-10-CM | POA: Diagnosis not present

## 2023-06-01 DIAGNOSIS — M6281 Muscle weakness (generalized): Secondary | ICD-10-CM | POA: Diagnosis not present

## 2023-06-08 ENCOUNTER — Ambulatory Visit
Admission: RE | Admit: 2023-06-08 | Discharge: 2023-06-08 | Disposition: A | Discharge status: Home / Self Care | Payer: BC Managed Care – PPO | Source: Ambulatory Visit | Attending: Family Medicine | Admitting: Family Medicine

## 2023-06-08 ENCOUNTER — Ambulatory Visit: Payer: BC Managed Care – PPO

## 2023-06-08 DIAGNOSIS — N644 Mastodynia: Secondary | ICD-10-CM

## 2023-06-16 DIAGNOSIS — I10 Essential (primary) hypertension: Secondary | ICD-10-CM | POA: Diagnosis not present

## 2023-06-16 DIAGNOSIS — Z9109 Other allergy status, other than to drugs and biological substances: Secondary | ICD-10-CM | POA: Diagnosis not present

## 2023-06-16 DIAGNOSIS — E782 Mixed hyperlipidemia: Secondary | ICD-10-CM | POA: Diagnosis not present

## 2023-09-06 ENCOUNTER — Other Ambulatory Visit: Payer: Self-pay | Admitting: Family Medicine

## 2023-09-06 DIAGNOSIS — Z1231 Encounter for screening mammogram for malignant neoplasm of breast: Secondary | ICD-10-CM

## 2023-09-15 ENCOUNTER — Other Ambulatory Visit: Payer: Self-pay

## 2023-09-15 ENCOUNTER — Encounter (HOSPITAL_BASED_OUTPATIENT_CLINIC_OR_DEPARTMENT_OTHER): Payer: Self-pay | Admitting: Emergency Medicine

## 2023-09-15 ENCOUNTER — Emergency Department (HOSPITAL_BASED_OUTPATIENT_CLINIC_OR_DEPARTMENT_OTHER)
Admission: EM | Admit: 2023-09-15 | Discharge: 2023-09-15 | Disposition: A | Discharge status: Home / Self Care | Attending: Emergency Medicine | Admitting: Emergency Medicine

## 2023-09-15 ENCOUNTER — Emergency Department (HOSPITAL_BASED_OUTPATIENT_CLINIC_OR_DEPARTMENT_OTHER)

## 2023-09-15 DIAGNOSIS — G4700 Insomnia, unspecified: Secondary | ICD-10-CM | POA: Diagnosis not present

## 2023-09-15 DIAGNOSIS — W260XXA Contact with knife, initial encounter: Secondary | ICD-10-CM | POA: Insufficient documentation

## 2023-09-15 DIAGNOSIS — S61432A Puncture wound without foreign body of left hand, initial encounter: Secondary | ICD-10-CM | POA: Diagnosis not present

## 2023-09-15 DIAGNOSIS — Z6827 Body mass index (BMI) 27.0-27.9, adult: Secondary | ICD-10-CM | POA: Diagnosis not present

## 2023-09-15 DIAGNOSIS — Z23 Encounter for immunization: Secondary | ICD-10-CM | POA: Insufficient documentation

## 2023-09-15 DIAGNOSIS — I10 Essential (primary) hypertension: Secondary | ICD-10-CM | POA: Diagnosis not present

## 2023-09-15 DIAGNOSIS — Y93G3 Activity, cooking and baking: Secondary | ICD-10-CM | POA: Insufficient documentation

## 2023-09-15 DIAGNOSIS — S61412A Laceration without foreign body of left hand, initial encounter: Secondary | ICD-10-CM | POA: Diagnosis not present

## 2023-09-15 DIAGNOSIS — N951 Menopausal and female climacteric states: Secondary | ICD-10-CM | POA: Diagnosis not present

## 2023-09-15 DIAGNOSIS — E88819 Insulin resistance, unspecified: Secondary | ICD-10-CM | POA: Diagnosis not present

## 2023-09-15 MED ORDER — LIDOCAINE HCL (PF) 1 % IJ SOLN
10.0000 mL | Freq: Once | INTRAMUSCULAR | Status: AC
  Administered 2023-09-15: 10 mL
  Filled 2023-09-15: qty 10

## 2023-09-15 MED ORDER — TETANUS-DIPHTH-ACELL PERTUSSIS 5-2.5-18.5 LF-MCG/0.5 IM SUSY
0.5000 mL | PREFILLED_SYRINGE | Freq: Once | INTRAMUSCULAR | Status: AC
  Administered 2023-09-15: 0.5 mL via INTRAMUSCULAR
  Filled 2023-09-15: qty 0.5

## 2023-09-15 NOTE — Discharge Instructions (Addendum)
 You were seen today for a laceration to the left hand after stabbing with a knife.  I have provided to sutures to keep the area closed.  It was cleaned.  Recommend he continue to keep the area clean with soap and water.  Observe for signs of infections which are increased swelling, increased pain, discolored drainage, warmth to the skin.  If any of these signs are present, return to the ED for further evaluation.  You can return to urgent care or primary care or can return to the ED for suture removal in 7 days.

## 2023-09-15 NOTE — ED Notes (Signed)
 Dressing applied to the area where the sutures was located with bacitracin.

## 2023-09-15 NOTE — ED Triage Notes (Signed)
 Pt reports she "stabbed herself in the hand with a knife" while cutting up food, has a laceration across the LT palm, clean edges, bleeding controlled, cleaned and pressure dressing applied.

## 2023-09-15 NOTE — ED Provider Notes (Signed)
 [place] [facility] AT [facility] Provider Note   CSN: [id number] Arrival date & time: 09/15/23  1903     History  Chief Complaint  Patient presents with   Laceration    Carrie Murillo is a 63 y.o. female.   Laceration Patient is a 63 year old female presents the ED today complaining of a puncture wound after trying to remove brownies from a pan with a knife or when she stabbed herself in the left palm.  Reports that she is up-to-date on her tetanus.  No weakness, numbness, tingling.     Home Medications Prior to Admission medications   Medication Sig Start Date End Date Taking? Authorizing Provider  amLODipine (NORVASC) 10 MG tablet Take 10 mg by mouth daily.    [provider]  amoxicillin-clavulanate (AUGMENTIN) 875-125 MG tablet Take 1 tablet by mouth 2 (two) times daily. 05/16/19   Adolphus Birchwood, MD  bimatoprost (LUMIGAN) 0.03 % ophthalmic solution Place 1 drop into both eyes at bedtime.    [provider]  ibuprofen (ADVIL) 600 MG tablet Take 1 tablet (600 mg total) by mouth every 6 (six) hours as needed for moderate pain. For AFTER surgery 04/16/19   Warner Mccreedy D, NP  omeprazole (PRILOSEC) 20 MG capsule Take 1 capsule (20 mg total) by mouth daily. 10/18/21   Arby Barrette, MD  oxyCODONE (OXY IR/ROXICODONE) 5 MG immediate release tablet Take 1 tablet (5 mg total) by mouth every 4 (four) hours as needed for severe pain. For AFTER surgery only, do not take and drive 40/98/11   Cross, Melissa D, NP  senna-docusate (SENOKOT-S) 8.6-50 MG tablet Take 2 tablets by mouth at bedtime. For AFTER surgery, do not take if having diarrhea 04/16/19   Cross, Efraim Kaufmann D, NP  valsartan-hydrochlorothiazide (DIOVAN-HCT) 160-12.5 MG tablet Take 1 tablet by mouth daily.    [provider]      Allergies    Patient has no known allergies.    Review of Systems   Review of Systems  Skin:  Positive for wound.  All other systems reviewed and are  negative.   Physical Exam Updated Vital Signs BP (!) 167/84 (BP Location: Right Arm)   Pulse 67   Temp (!) 97.1 F (36.2 C)   Resp 18   Ht 5\' 3"  (1.6 m)   Wt 69.9 kg   SpO2 100%   BMI 27.28 kg/m  Physical Exam Vitals and nursing note reviewed.  Constitutional:      General: She is not in acute distress.    Appearance: Normal appearance. She is not ill-appearing.  HENT:     Head: Normocephalic and atraumatic.  Eyes:     Extraocular Movements: Extraocular movements intact.     Conjunctiva/sclera: Conjunctivae normal.  Cardiovascular:     Rate and Rhythm: Normal rate and regular rhythm.     Pulses: Normal pulses.     Heart sounds: Normal heart sounds. No murmur heard.    No friction rub. No gallop.  Pulmonary:     Effort: Pulmonary effort is normal. No respiratory distress.     Breath sounds: Normal breath sounds.  Abdominal:     General: Abdomen is flat.     Palpations: Abdomen is soft.     Tenderness: There is no abdominal tenderness.  Musculoskeletal:        General: Tenderness (Tenderness noted to the palmar and dorsal aspect of left hand around the laceration.) and signs of injury (2 cm laceration noted on the  palmar aspect of left hand.) present. No swelling or deformity. Normal range of motion.  Skin:    General: Skin is warm and dry.     Capillary Refill: Capillary refill takes less than 2 seconds.     Findings: Bruising present. No lesion.  Neurological:     General: No focal deficit present.     Mental Status: She is alert. Mental status is at baseline.     Sensory: No sensory deficit.     Motor: No weakness.     Gait: Gait normal.  Psychiatric:        Mood and Affect: Mood normal.     ED Results / Procedures / Treatments   Labs (all labs ordered are listed, but only abnormal results are displayed) Labs Reviewed - No data to display  EKG None  Radiology DG Hand Complete Left Result Date: 09/15/2023 CLINICAL DATA:  Puncture wound to the palm.  EXAM: LEFT HAND - COMPLETE 3+ VIEW COMPARISON:  None Available. FINDINGS: There is no evidence of fracture or dislocation. There is no evidence of arthropathy or other focal bone abnormality. No radiopaque foreign body. The site of laceration is not defined by radiograph. Soft tissues are unremarkable. IMPRESSION: Negative radiographs of the left hand. No radiopaque foreign body. Electronically Signed   By: Narda Rutherford M.D.   On: 09/15/2023 19:36    Procedures .Laceration Repair  Date/Time: 09/15/2023 9:43 PM  Performed by: Lunette Stands, PA-C Authorized by: Lunette Stands, PA-C   Consent:    Consent obtained:  Verbal   Consent given by:  Patient   Risks, benefits, and alternatives were discussed: yes     Risks discussed:  Infection, pain, retained foreign body, need for additional repair, poor cosmetic result, vascular damage, poor wound healing and nerve damage   Alternatives discussed:  No treatment and delayed treatment Universal protocol:    Procedure explained and questions answered to patient or proxy's satisfaction: yes     Relevant documents present and verified: yes     Imaging studies available: yes     Patient identity confirmed:  Verbally with patient and arm band Anesthesia:    Anesthesia method:  Local infiltration   Local anesthetic:  Lidocaine 1% w/o epi Laceration details:    Location:  Hand   Hand location:  L palm   Length (cm):  2 Pre-procedure details:    Preparation:  Imaging obtained to evaluate for foreign bodies and patient was prepped and draped in usual sterile fashion Exploration:    Hemostasis achieved with:  Direct pressure   Imaging obtained: x-ray     Imaging outcome: foreign body not noted     Wound exploration: wound explored through full range of motion and entire depth of wound visualized     Contaminated: no   Treatment:    Area cleansed with:  Povidone-iodine   Amount of cleaning:  Standard   Irrigation solution:  Sterile saline    Irrigation method:  Syringe   Visualized foreign bodies/material removed: no   Skin repair:    Repair method:  Sutures   Suture size:  5-0   Suture material:  Prolene   Suture technique:  Simple interrupted   Number of sutures:  2 Repair type:    Repair type:  Simple Post-procedure details:    Dressing:  Adhesive bandage and non-adherent dressing   Procedure completion:  Tolerated well, no immediate complications     Medications Ordered in ED Medications  lidocaine (PF) (  XYLOCAINE) 1 % injection 10 mL (10 mLs Infiltration Given 09/15/23 1948)  Tdap (BOOSTRIX) injection 0.5 mL (0.5 mLs Intramuscular Given 09/15/23 1949)    ED Course/ Medical Decision Making/ A&P                                 Medical Decision Making Amount and/or Complexity of Data Reviewed Radiology: ordered.  Risk Prescription drug management.   Patient is a 63 year old female presents the ED today complaining of a puncture wound after trying to remove brownies from a pan with a knife or when she stabbed herself in the left palm.  Reports that she is up-to-date on her tetanus.   On physical exam, patient noticed to have a 2 cm laceration to the palmar aspect of her left hand with bruising noted to dorsal aspect of left hand.  Full range of motion present with all digits as well as normal sensation and normal cap refill with a radial pulse that is 2+.  Bleeding is controlled at this time.  X-ray of hand was negative for any foreign bodies or fractures.  Patient was provided Tdap shot because on review of her chart, last Tdap was provided in 2017.  Area was cleaned and sutures were placed and patient was given return ED precautions.  Patient expressed agreement understanding of plan.  Low suspicion for any emergent process present this time.  Recommend that the patient follow-up with PCP and/or urgent care for suture removal in 7 days as well as can return to ED for suture removal.  I believe patient safe to  discharge at this time.  Differential diagnoses prior to evaluation: Fracture, ligamentous injury, vascular injury, foreign body, nervous injury.  Past Medical History / Social History / Additional history: Chart reviewed. Pertinent results include: Last Tdap was known to be given in 2017.  Medications / Treatment: Lidocaine and Tdap   Disposition: After consideration of the diagnostic results and the patients response to treatment, I feel that patient benefit from discharge and treatment as above.   emergency department workup does not suggest an emergent condition requiring admission or immediate intervention beyond what has been performed at this time. The plan is: Follow-up with PCP in 7 days to have sutures removed, observe for signs of infections, return for new or worsening symptoms. The patient is safe for discharge and has been instructed to return immediately for worsening symptoms, change in symptoms or any other concerns.  Final Clinical Impression(s) / ED Diagnoses Final diagnoses:  Laceration of left hand without foreign body, initial encounter    Rx / DC Orders ED Discharge Orders     None         Lavonia Drafts 09/15/23 2144    Glyn Ade, MD 09/16/23 2039

## 2023-09-20 DIAGNOSIS — H40053 Ocular hypertension, bilateral: Secondary | ICD-10-CM | POA: Diagnosis not present

## 2023-09-20 DIAGNOSIS — H2703 Aphakia, bilateral: Secondary | ICD-10-CM | POA: Diagnosis not present

## 2023-09-22 ENCOUNTER — Encounter (HOSPITAL_BASED_OUTPATIENT_CLINIC_OR_DEPARTMENT_OTHER): Payer: Self-pay | Admitting: Emergency Medicine

## 2023-09-22 ENCOUNTER — Other Ambulatory Visit: Payer: Self-pay

## 2023-09-22 ENCOUNTER — Emergency Department (HOSPITAL_BASED_OUTPATIENT_CLINIC_OR_DEPARTMENT_OTHER)
Admission: EM | Admit: 2023-09-22 | Discharge: 2023-09-22 | Disposition: A | Discharge status: Home / Self Care | Attending: Emergency Medicine | Admitting: Emergency Medicine

## 2023-09-22 DIAGNOSIS — N951 Menopausal and female climacteric states: Secondary | ICD-10-CM | POA: Diagnosis not present

## 2023-09-22 DIAGNOSIS — I10 Essential (primary) hypertension: Secondary | ICD-10-CM | POA: Diagnosis not present

## 2023-09-22 DIAGNOSIS — Z5941 Food insecurity: Secondary | ICD-10-CM | POA: Diagnosis not present

## 2023-09-22 DIAGNOSIS — E785 Hyperlipidemia, unspecified: Secondary | ICD-10-CM | POA: Diagnosis not present

## 2023-09-22 DIAGNOSIS — X58XXXD Exposure to other specified factors, subsequent encounter: Secondary | ICD-10-CM | POA: Diagnosis not present

## 2023-09-22 DIAGNOSIS — Z4802 Encounter for removal of sutures: Secondary | ICD-10-CM | POA: Insufficient documentation

## 2023-09-22 DIAGNOSIS — M1990 Unspecified osteoarthritis, unspecified site: Secondary | ICD-10-CM | POA: Diagnosis not present

## 2023-09-22 DIAGNOSIS — Z791 Long term (current) use of non-steroidal anti-inflammatories (NSAID): Secondary | ICD-10-CM | POA: Diagnosis not present

## 2023-09-22 DIAGNOSIS — H409 Unspecified glaucoma: Secondary | ICD-10-CM | POA: Diagnosis not present

## 2023-09-22 DIAGNOSIS — Z8249 Family history of ischemic heart disease and other diseases of the circulatory system: Secondary | ICD-10-CM | POA: Diagnosis not present

## 2023-09-22 DIAGNOSIS — Z5986 Financial insecurity: Secondary | ICD-10-CM | POA: Diagnosis not present

## 2023-09-22 DIAGNOSIS — Z809 Family history of malignant neoplasm, unspecified: Secondary | ICD-10-CM | POA: Diagnosis not present

## 2023-09-22 DIAGNOSIS — S61412D Laceration without foreign body of left hand, subsequent encounter: Secondary | ICD-10-CM | POA: Insufficient documentation

## 2023-09-22 DIAGNOSIS — J301 Allergic rhinitis due to pollen: Secondary | ICD-10-CM | POA: Diagnosis not present

## 2023-09-22 DIAGNOSIS — Z5948 Other specified lack of adequate food: Secondary | ICD-10-CM | POA: Diagnosis not present

## 2023-09-22 NOTE — ED Notes (Signed)

## 2023-09-22 NOTE — ED Triage Notes (Signed)
 Pt POV steady gait- here for suture removal that were placed 7 days ago, denies antibiotics.  Denies concern for infection.

## 2023-09-22 NOTE — ED Notes (Signed)
 Here for suture removal to L palm. 2x sutures.

## 2023-09-22 NOTE — ED Provider Notes (Signed)
 Carrie Murillo Provider Note   CSN: [id number] Arrival date & time: 09/22/23  1654     History Chief Complaint  Patient presents with   Suture / Staple Removal    Carrie Murillo is a 63 y.o. female patient who presents to the emergency department for further evaluation of a suture removal.  She has 2 sutures in place and she has had them for 7 days in the left hand.  She denies any new injury, fever, chills.   Suture / Staple Removal       Home Medications Prior to Admission medications   Medication Sig Start Date End Date Taking? Authorizing Provider  amLODipine (NORVASC) 10 MG tablet Take 10 mg by mouth daily.    [provider]  amoxicillin-clavulanate (AUGMENTIN) 875-125 MG tablet Take 1 tablet by mouth 2 (two) times daily. 05/16/19   Carrie Birchwood, MD  bimatoprost (LUMIGAN) 0.03 % ophthalmic solution Place 1 drop into both eyes at bedtime.    [provider]  ibuprofen (ADVIL) 600 MG tablet Take 1 tablet (600 mg total) by mouth every 6 (six) hours as needed for moderate pain. For AFTER surgery 04/16/19   Carrie Mccreedy D, NP  omeprazole (PRILOSEC) 20 MG capsule Take 1 capsule (20 mg total) by mouth daily. 10/18/21   Carrie Barrette, MD  oxyCODONE (OXY IR/ROXICODONE) 5 MG immediate release tablet Take 1 tablet (5 mg total) by mouth every 4 (four) hours as needed for severe pain. For AFTER surgery only, do not take and drive 41/32/44   Murillo, Carrie D, NP  senna-docusate (SENOKOT-S) 8.6-50 MG tablet Take 2 tablets by mouth at bedtime. For AFTER surgery, do not take if having diarrhea 04/16/19   Murillo, Carrie Kaufmann D, NP  valsartan-hydrochlorothiazide (DIOVAN-HCT) 160-12.5 MG tablet Take 1 tablet by mouth daily.    [provider]      Allergies    Patient has no known allergies.    Review of Systems   Review of Systems  All other systems reviewed and are negative.   Physical Exam Updated Vital Signs BP (!)  142/88 (BP Location: Left Arm)   Pulse 63   Temp 97.7 F (36.5 C)   Resp 18   Ht 5\' 3"  (1.6 m)   Wt 69.9 kg   SpO2 100%   BMI 27.28 kg/m  Physical Exam Vitals and nursing note reviewed.  Constitutional:      Appearance: Normal appearance.  HENT:     Head: Normocephalic and atraumatic.  Eyes:     General:        Right eye: No discharge.        Left eye: No discharge.     Conjunctiva/sclera: Conjunctivae normal.  Pulmonary:     Effort: Pulmonary effort is normal.  Skin:    General: Skin is warm and dry.     Findings: No rash.     Comments: Sutures were removed.  Wound appears well-healing.  Neurological:     General: No focal deficit present.     Mental Status: She is alert.  Psychiatric:        Mood and Affect: Mood normal.        Behavior: Behavior normal.     ED Results / Procedures / Treatments   Labs (all labs ordered are listed, but only abnormal results are displayed) Labs Reviewed - No data to display  EKG None  Radiology No results found.  Procedures Suture Removal  Date/Time:  09/22/2023 5:56 PM  Performed by: Teressa Lower, PA-C Authorized by: Teressa Lower, PA-C   Consent:    Consent obtained:  Verbal   Consent given by:  Patient Universal protocol:    Procedure explained and questions answered to patient or proxy's satisfaction: yes     Relevant documents present and verified: yes     Test results available: no     Imaging studies available: no     Required blood products, implants, devices, and special equipment available: no     Site/side marked: no     Immediately prior to procedure, a time out was called: no     Patient identity confirmed:  Verbally with patient Location:    Location: Left hand. Procedure details:    Wound appearance:  No signs of infection   Number of sutures removed:  2 Post-procedure details:    Procedure completion:  Tolerated well, no immediate complications     Medications Ordered in  ED Medications - No data to display  ED Course/ Medical Decision Making/ A&P   {   Click here for ABCD2, HEART and other calculators  Medical Decision Making [name] is a 63 y.o. female patient who presents to the Emergency Department for suture removal.  Sutures were removed here.  She is safe for discharge. No signs of infection.     Final Clinical Impression(s) / ED Diagnoses Final diagnoses:  Visit for suture removal    Rx / DC Orders ED Discharge Orders     None         Teressa Lower, PA-C 09/22/23 1757    Virgina Norfolk, DO 09/22/23 2118

## 2023-09-22 NOTE — Discharge Instructions (Signed)
 You may return to the emergency department for any worsening symptoms.

## 2023-10-20 ENCOUNTER — Ambulatory Visit
Admission: RE | Admit: 2023-10-20 | Discharge: 2023-10-20 | Disposition: A | Discharge status: Home / Self Care | Source: Ambulatory Visit | Attending: Family Medicine | Admitting: Family Medicine

## 2023-10-20 DIAGNOSIS — G4700 Insomnia, unspecified: Secondary | ICD-10-CM | POA: Diagnosis not present

## 2023-10-20 DIAGNOSIS — Z1231 Encounter for screening mammogram for malignant neoplasm of breast: Secondary | ICD-10-CM

## 2023-10-20 DIAGNOSIS — I10 Essential (primary) hypertension: Secondary | ICD-10-CM | POA: Diagnosis not present

## 2023-10-20 DIAGNOSIS — E785 Hyperlipidemia, unspecified: Secondary | ICD-10-CM | POA: Diagnosis not present

## 2023-10-20 DIAGNOSIS — G44209 Tension-type headache, unspecified, not intractable: Secondary | ICD-10-CM | POA: Diagnosis not present

## 2023-10-25 ENCOUNTER — Other Ambulatory Visit: Payer: Self-pay | Admitting: Family Medicine

## 2023-10-25 DIAGNOSIS — R928 Other abnormal and inconclusive findings on diagnostic imaging of breast: Secondary | ICD-10-CM

## 2023-11-04 ENCOUNTER — Other Ambulatory Visit: Payer: Self-pay | Admitting: Family Medicine

## 2023-11-04 ENCOUNTER — Ambulatory Visit
Admission: RE | Admit: 2023-11-04 | Discharge: 2023-11-04 | Disposition: A | Discharge status: Home / Self Care | Source: Ambulatory Visit | Attending: Family Medicine | Admitting: Family Medicine

## 2023-11-04 DIAGNOSIS — Z803 Family history of malignant neoplasm of breast: Secondary | ICD-10-CM | POA: Diagnosis not present

## 2023-11-04 DIAGNOSIS — R928 Other abnormal and inconclusive findings on diagnostic imaging of breast: Secondary | ICD-10-CM | POA: Diagnosis not present

## 2023-11-04 DIAGNOSIS — N6489 Other specified disorders of breast: Secondary | ICD-10-CM

## 2023-11-16 ENCOUNTER — Ambulatory Visit
Admission: RE | Admit: 2023-11-16 | Discharge: 2023-11-16 | Disposition: A | Discharge status: Home / Self Care | Source: Ambulatory Visit | Attending: Family Medicine | Admitting: Family Medicine

## 2023-11-16 DIAGNOSIS — N6489 Other specified disorders of breast: Secondary | ICD-10-CM

## 2023-11-16 DIAGNOSIS — R928 Other abnormal and inconclusive findings on diagnostic imaging of breast: Secondary | ICD-10-CM | POA: Diagnosis not present

## 2023-11-16 DIAGNOSIS — N641 Fat necrosis of breast: Secondary | ICD-10-CM | POA: Diagnosis not present

## 2023-11-16 HISTORY — PX: BREAST BIOPSY: SHX20

## 2023-11-17 LAB — SURGICAL PATHOLOGY

## 2024-03-22 DIAGNOSIS — H40053 Ocular hypertension, bilateral: Secondary | ICD-10-CM | POA: Diagnosis not present

## 2024-03-22 DIAGNOSIS — H2703 Aphakia, bilateral: Secondary | ICD-10-CM | POA: Diagnosis not present

## 2024-05-04 ENCOUNTER — Emergency Department (HOSPITAL_COMMUNITY)

## 2024-05-04 ENCOUNTER — Emergency Department (HOSPITAL_BASED_OUTPATIENT_CLINIC_OR_DEPARTMENT_OTHER)

## 2024-05-04 ENCOUNTER — Emergency Department (HOSPITAL_BASED_OUTPATIENT_CLINIC_OR_DEPARTMENT_OTHER)
Admission: EM | Admit: 2024-05-04 | Discharge: 2024-05-04 | Disposition: A | Discharge status: Home / Self Care | Attending: Emergency Medicine | Admitting: Emergency Medicine

## 2024-05-04 ENCOUNTER — Other Ambulatory Visit: Payer: Self-pay

## 2024-05-04 ENCOUNTER — Encounter (HOSPITAL_BASED_OUTPATIENT_CLINIC_OR_DEPARTMENT_OTHER): Payer: Self-pay

## 2024-05-04 DIAGNOSIS — I10 Essential (primary) hypertension: Secondary | ICD-10-CM | POA: Diagnosis not present

## 2024-05-04 DIAGNOSIS — R296 Repeated falls: Secondary | ICD-10-CM | POA: Diagnosis not present

## 2024-05-04 DIAGNOSIS — R112 Nausea with vomiting, unspecified: Secondary | ICD-10-CM | POA: Diagnosis not present

## 2024-05-04 DIAGNOSIS — R519 Headache, unspecified: Secondary | ICD-10-CM | POA: Insufficient documentation

## 2024-05-04 DIAGNOSIS — R42 Dizziness and giddiness: Secondary | ICD-10-CM | POA: Insufficient documentation

## 2024-05-04 LAB — CBC WITH DIFFERENTIAL/PLATELET
Abs Immature Granulocytes: 0.01 K/uL (ref 0.00–0.07)
Basophils Absolute: 0.1 K/uL (ref 0.0–0.1)
Basophils Relative: 1 %
Eosinophils Absolute: 0.1 K/uL (ref 0.0–0.5)
Eosinophils Relative: 2 %
HCT: 38.6 % (ref 36.0–46.0)
Hemoglobin: 13.7 g/dL (ref 12.0–15.0)
Immature Granulocytes: 0 %
Lymphocytes Relative: 25 %
Lymphs Abs: 1.5 K/uL (ref 0.7–4.0)
MCH: 29 pg (ref 26.0–34.0)
MCHC: 35.5 g/dL (ref 30.0–36.0)
MCV: 81.6 fL (ref 80.0–100.0)
Monocytes Absolute: 0.5 K/uL (ref 0.1–1.0)
Monocytes Relative: 8 %
Neutro Abs: 4 K/uL (ref 1.7–7.7)
Neutrophils Relative %: 64 %
Platelets: 244 K/uL (ref 150–400)
RBC: 4.73 MIL/uL (ref 3.87–5.11)
RDW: 12.5 % (ref 11.5–15.5)
WBC: 6.1 K/uL (ref 4.0–10.5)
nRBC: 0 % (ref 0.0–0.2)

## 2024-05-04 LAB — COMPREHENSIVE METABOLIC PANEL WITH GFR
ALT: 18 U/L (ref 0–44)
AST: 20 U/L (ref 15–41)
Albumin: 4.3 g/dL (ref 3.5–5.0)
Alkaline Phosphatase: 77 U/L (ref 38–126)
Anion gap: 11 (ref 5–15)
BUN: 24 mg/dL — ABNORMAL HIGH (ref 8–23)
CO2: 23 mmol/L (ref 22–32)
Calcium: 9.1 mg/dL (ref 8.9–10.3)
Chloride: 105 mmol/L (ref 98–111)
Creatinine, Ser: 0.89 mg/dL (ref 0.44–1.00)
GFR, Estimated: >60 mL/min (ref >60)
Glucose, Bld: 108 mg/dL — ABNORMAL HIGH (ref 70–99)
Potassium: 3.3 mmol/L — ABNORMAL LOW (ref 3.5–5.1)
Sodium: 140 mmol/L (ref 135–145)
Total Bilirubin: 0.6 mg/dL (ref 0.0–1.2)
Total Protein: 7.1 g/dL (ref 6.5–8.1)

## 2024-05-04 LAB — URINALYSIS, W/ REFLEX TO CULTURE (INFECTION SUSPECTED)
Bilirubin Urine: NEGATIVE
Glucose, UA: NEGATIVE mg/dL
Hgb urine dipstick: NEGATIVE
Ketones, ur: NEGATIVE mg/dL
Leukocytes,Ua: NEGATIVE
Nitrite: NEGATIVE
Protein, ur: NEGATIVE mg/dL
Specific Gravity, Urine: 1.01 (ref 1.005–1.030)
pH: 7 (ref 5.0–8.0)

## 2024-05-04 LAB — TSH: TSH: 0.887 u[IU]/mL (ref 0.350–4.500)

## 2024-05-04 LAB — VITAMIN B12: Vitamin B-12: 516 pg/mL (ref 180–914)

## 2024-05-04 LAB — LIPASE, BLOOD: Lipase: 41 U/L (ref 11–51)

## 2024-05-04 MED ORDER — ONDANSETRON 4 MG PO TBDP: 4.0000 mg | ORAL_TABLET | Freq: Three times a day (TID) | ORAL | 0 refills | Status: AC | PRN

## 2024-05-04 MED ORDER — POTASSIUM CHLORIDE CRYS ER 20 MEQ PO TBCR
40.0000 meq | EXTENDED_RELEASE_TABLET | Freq: Once | ORAL | Status: AC
  Administered 2024-05-04: 40 meq via ORAL
  Filled 2024-05-04: qty 2

## 2024-05-04 MED ORDER — MECLIZINE HCL 25 MG PO TABS
50.0000 mg | ORAL_TABLET | Freq: Once | ORAL | Status: AC
  Administered 2024-05-04: 50 mg via ORAL
  Filled 2024-05-04: qty 2

## 2024-05-04 MED ORDER — IOHEXOL 350 MG/ML SOLN
75.0000 mL | Freq: Once | INTRAVENOUS | Status: AC | PRN
  Administered 2024-05-04: 75 mL via INTRAVENOUS

## 2024-05-04 MED ORDER — MECLIZINE HCL 25 MG PO TABS: 25.0000 mg | ORAL_TABLET | Freq: Three times a day (TID) | ORAL | 0 refills | Status: AC | PRN

## 2024-05-04 MED ORDER — SODIUM CHLORIDE 0.9 % IV BOLUS
1000.0000 mL | Freq: Once | INTRAVENOUS | Status: AC
  Administered 2024-05-04: 1000 mL via INTRAVENOUS

## 2024-05-04 NOTE — ED Provider Notes (Signed)
 [place] [facility] Provider Note   CSN: [id number] Arrival date & time: 05/04/24  9365     Patient presents with: Dizziness   Carrie Murillo is a 63 y.o. female.   The history is provided by the patient and medical records. No language interpreter was used.  Dizziness Quality:  Room spinning and head spinning Severity:  Severe Onset quality:  Sudden Duration:  3 hours Timing:  Constant Progression:  Waxing and waning Chronicity:  New Relieved by:  Nothing Worsened by:  Movement, sitting upright, turning head, standing up and eye movement Ineffective treatments:  None tried Associated symptoms: headaches, nausea, tinnitus and vomiting   Associated symptoms: no chest pain, no diarrhea, no palpitations, no shortness of breath, no syncope, no vision changes and no weakness   Risk factors: no hx of stroke and no hx of vertigo        Prior to Admission medications   Medication Sig Start Date End Date Taking? Authorizing Provider  amLODipine (NORVASC) 10 MG tablet Take 10 mg by mouth daily.    [provider]  amoxicillin -clavulanate (AUGMENTIN ) 875-125 MG tablet Take 1 tablet by mouth 2 (two) times daily. 05/16/19   Eloy Herring, MD  bimatoprost (LUMIGAN) 0.03 % ophthalmic solution Place 1 drop into both eyes at bedtime.    [provider]  ibuprofen  (ADVIL ) 600 MG tablet Take 1 tablet (600 mg total) by mouth every 6 (six) hours as needed for moderate pain. For AFTER surgery 04/16/19   Cross, Melissa D, NP  omeprazole  (PRILOSEC) 20 MG capsule Take 1 capsule (20 mg total) by mouth daily. 10/18/21   Armenta Canning, MD  oxyCODONE  (OXY IR/ROXICODONE ) 5 MG immediate release tablet Take 1 tablet (5 mg total) by mouth every 4 (four) hours as needed for severe pain. For AFTER surgery only, do not take and drive 89/80/79   Cross, Melissa D, NP  senna-docusate (SENOKOT-S) 8.6-50 MG tablet Take 2 tablets by mouth at bedtime. For AFTER  surgery, do not take if having diarrhea 04/16/19   Cross, Melissa D, NP  valsartan-hydrochlorothiazide (DIOVAN-HCT) 160-12.5 MG tablet Take 1 tablet by mouth daily.    [provider]    Allergies: Patient has no known allergies.    Review of Systems  Constitutional:  Negative for chills, fatigue and fever.  HENT:  Positive for tinnitus. Negative for congestion.   Eyes:  Negative for visual disturbance.  Respiratory:  Negative for cough, chest tightness, shortness of breath and wheezing.   Cardiovascular:  Negative for chest pain, palpitations and syncope.  Gastrointestinal:  Positive for nausea and vomiting. Negative for abdominal pain and diarrhea.  Genitourinary:  Negative for dysuria, flank pain and frequency.  Musculoskeletal:  Positive for neck pain. Negative for back pain and neck stiffness.  Skin:  Negative for rash and wound.  Neurological:  Positive for dizziness and headaches. Negative for seizures, syncope, speech difficulty, weakness, light-headedness and numbness (tingling in legs on and off for months).  Psychiatric/Behavioral:  Negative for agitation and confusion.   All other systems reviewed and are negative.   Updated Vital Signs BP 135/79   Pulse 60   Temp 98.6 F (37 C) (Oral)   Resp 20   SpO2 100%   Physical Exam Vitals and nursing note reviewed.  Constitutional:      General: She is not in acute distress.    Appearance: She is well-developed. She is not ill-appearing, toxic-appearing or diaphoretic.  HENT:  Head: Normocephalic and atraumatic.     Nose: No congestion or rhinorrhea.     Mouth/Throat:     Mouth: Mucous membranes are dry.     Pharynx: No oropharyngeal exudate or posterior oropharyngeal erythema.  Eyes:     Extraocular Movements:     Right eye: Nystagmus present.     Left eye: Nystagmus present.     Conjunctiva/sclera: Conjunctivae normal.     Pupils: Pupils are equal, round, and reactive to light.  Cardiovascular:     Rate  and Rhythm: Normal rate and regular rhythm.     Heart sounds: No murmur heard. Pulmonary:     Effort: Pulmonary effort is normal. No respiratory distress.     Breath sounds: Normal breath sounds. No wheezing, rhonchi or rales.  Chest:     Chest wall: No tenderness.  Abdominal:     General: Abdomen is flat.     Palpations: Abdomen is soft.     Tenderness: There is no abdominal tenderness. There is no guarding or rebound.  Musculoskeletal:        General: No swelling or tenderness.     Cervical back: Neck supple.     Right lower leg: No edema.     Left lower leg: No edema.  Skin:    General: Skin is warm and dry.     Capillary Refill: Capillary refill takes less than 2 seconds.     Findings: No erythema or rash.  Neurological:     General: No focal deficit present.     Mental Status: She is alert.     GCS: GCS eye subscore is 4. GCS verbal subscore is 5. GCS motor subscore is 6.     Cranial Nerves: No dysarthria or facial asymmetry.     Sensory: No sensory deficit.     Motor: No weakness, abnormal muscle tone or seizure activity.     Coordination: Coordination normal. Finger-Nose-Finger Test normal.  Psychiatric:        Mood and Affect: Mood normal.     (all labs ordered are listed, but only abnormal results are displayed) Labs Reviewed  URINALYSIS, W/ REFLEX TO CULTURE (INFECTION SUSPECTED) - Abnormal; Notable for the following components:      Result Value   Bacteria, UA FEW (*)    All other components within normal limits  COMPREHENSIVE METABOLIC PANEL WITH GFR - Abnormal; Notable for the following components:   Potassium 3.3 (*)    Glucose, Bld 108 (*)    BUN 24 (*)    All other components within normal limits  CBC WITH DIFFERENTIAL/PLATELET  LIPASE, BLOOD  TSH  VITAMIN B12    EKG: EKG Interpretation Date/Time:  Friday May 04 2024 06:47:38 EST Ventricular Rate:  67 PR Interval:  203 QRS Duration:  72 QT Interval:  415 QTC Calculation: 439 R  Axis:   28  Text Interpretation: Sinus rhythm Low voltage, precordial leads Probable anteroseptal infarct, old Minimal ST elevation, inferior leads when compared top rior, similar appearance No STEMI Confirmed by Ginger Barefoot ([id number]) on 05/04/2024 6:55:39 AM  Radiology: CT ANGIO HEAD NECK W WO CM Result Date: 05/04/2024 EXAM: CTA Head and Neck with Intravenous Contrast. CT Head without Contrast. CLINICAL HISTORY: 63 year old female with central vertigo, dizziness, neck-to-head pain, 2 falls in last month, and occasional leg tingling. TECHNIQUE: Axial CTA images of the head and neck performed with intravenous contrast. MIP reconstructed images were created and reviewed. Axial computed tomography images of the head/brain performed without intravenous  contrast. Note: Per PQRS, the description of internal carotid artery percent stenosis, including 0 percent or normal exam, is based on North American Symptomatic Carotid Endarterectomy Trial (NASCET) criteria. Dose reduction technique was used including one or more of the following: automated exposure control, adjustment of mA and kV according to patient size, and/or iterative reconstruction. CONTRAST: With and without; 75 mL iohexol  (OMNIPAQUE ) 350 MG/ML injection. COMPARISON: Prior head CT 06/13/2020. FINDINGS: CT HEAD: BRAIN: No acute intraparenchymal hemorrhage. No mass lesion. No CT evidence for acute territorial infarct. No midline shift or extra-axial collection. Brain volume remains normal for age. VENTRICLES: No hydrocephalus. ORBITS: The orbits are unremarkable. SINUSES AND MASTOIDS: The paranasal sinuses are clear. Bilateral tympanic cavities and mastoids appear clear. OTHER CT HEAD FINDINGS: No gaze deviation. CTA NECK: COMMON CAROTID ARTERIES: Normal right CCA. Normal left CCA. No significant stenosis. No dissection or occlusion. INTERNAL CAROTID ARTERIES: Cervical right ICA: Normal. Right ICA siphon: Mild calcified plaque without stenosis. Normal  right posterior communicating artery origin. Cervical left ICA: Normal with no plaque or stenosis. Left ICA siphon: Minimal calcified plaque without stenosis. Normal left posterior communicating artery origin. No stenosis by NASCET criteria. No dissection or occlusion. VERTEBRAL ARTERIES: Proximal right subclavian artery and right vertebral artery origin are normal. Right vertebral artery is mildly dominant and normal to the vertebrobasilar junction. Normal right PICA origin. Proximal left subclavian artery and left vertebral artery origin are normal. Left vertebral artery is mildly nondominant, patent and normal to the vertebrobasilar junction. Left AICA appears to be dominant, normal variant. No significant stenosis. No dissection or occlusion. CTA HEAD: ANTERIOR CEREBRAL ARTERIES: Normal ACA origins. Diminutive anterior communicating artery. No significant stenosis. No occlusion. No aneurysm. MIDDLE CEREBRAL ARTERIES: Normal MCA origins. No significant stenosis. No occlusion. No aneurysm. POSTERIOR CEREBRAL ARTERIES: Fetal type bilateral PCA origins, normal variant. No significant stenosis. No occlusion. No aneurysm. BASILAR ARTERY: Normal basilar artery. No significant stenosis. No occlusion. No aneurysm. OTHER CTA HEAD FINDINGS: Normal SCA origins. OTHER: SOFT TISSUES: Nonvascular neck soft tissue spaces appear within normal limits. Negative visible upper chest. Major dural venous sinuses are enhancing and appear to be patent. Asymmetric right cavernous sinus enhancement is felt to be physiologic. BONES: Mild for age cervical spine degeneration. No acute osseous abnormality. IMPRESSION: 1. No acute intracranial abnormality. 2. Minimal for age atherosclerosis in the head and neck. No stenosis, aneurysm, or dissection. Electronically signed by: Helayne Hurst MD 05/04/2024 08:56 AM EST RP Workstation: [id number]     Procedures   Medications Ordered in the ED  sodium chloride  0.9 % bolus 1,000 mL (has no  administration in time range)  meclizine (ANTIVERT) tablet 50 mg (has no administration in time range)                                    Medical Decision Making Amount and/or Complexity of Data Reviewed Labs: ordered. Radiology: ordered.  Risk Prescription drug management.    Carrie Murillo is a 63 y.o. female with a past history significant for hypertension hysterectomy and bilateral salpingo-oophorectomy who presents with dizziness and falls.  According to patient and family, patient went to bed about 8 PM last night feeling normal and then woke up to go to the bathroom at 1 AM also feeling normal.  She reports she woke up at 430 and has had profound room spinning and head spinning dizziness.  She has never had this dizziness before  however she does report that over the last month she has had 2 separate falls that she does not exactly know why she fell.  She denies any vision changes or speech changes but has had some nausea and vomiting this morning with the dizziness.  She says it was worse when she sat up and stood up and twisted her head and moved her head.  No personal history of vertigo.  No recent medication changes.  She does report that over months she has had some tingling on and off in her legs that is not persistent.  Otherwise she is reporting pain in her right posterior neck going into her head but she is having a mild headache.  She denies any recent chiropractor manipulation injury.  She denies fevers, chills, congestion, cough, constipation, diarrhea, or urinary changes.  Denies symptoms in her arms or legs including no numbness or weakness reported.  On exam, lungs clear.  Chest nontender.  No murmur.  Abdomen nontender.  Back and flanks nontender.  She has some tenderness in her right paraspinal neck going into her head.  Pupils are symmetric and reactive but she did have nystagmus.  Otherwise symmetric smile.  Clear speech.  Intact sensation and strength in arms and legs.   Normal finger-nose-finger testing.  Normal heel shin testing.  When sitting up patient was severely dizzy.  Clinically I suspect patient has peripheral vertigo and her last normal was 1 AM.  Given this pain in her right neck going into her head and her 2 recent falls over the last month, I do feel she needs imaging to rule out concerning etiology such as a vertebral artery dissection or stroke.  She did have dry normal range.  Will give her fluids initially head neck.  Will get some screening labs.  With the bilateral leg tingling we will check a B12 with other electrolytes.  EKG does not show STEMI.  Anticipate reassessment after initial workup and possible discussion with neurology if symptoms persist.  9:07 AM Workup continues to return.  CT scan does not show evidence of acute stroke or dissection or bleed.  Potassium slightly low but otherwise labs and electrolytes reassuring.  TSH and B12 still in process and may not return today and she will follow-up with her PCP for this.  Lipase normal.  Urinalysis does not show UTI.  Will discuss determine if patient needs MRI.  She said that dizziness has improved significantly, she still had some dizziness when she went to the bathroom.  9:12 AM I called and spoke with Dr. Matthews with neurology who listened to the story and reviewed the case and images.  She does feel that the patient would benefit MRI brain without contrast to definitively rule out stroke today given the recent unsteadiness and falls.  If this is negative suspect this is peripheral vertigo and she can follow-up with outpatient PCP and ENT.  9:24 AM Spoke to Dr. Sheppard Pereyra who accepts the patient for ED to ED transfer to get the MRIs.  If MRI reassuring, anticipate discharge home with ENT follow-up for vertigo.     Final diagnoses:  Dizziness  Falls    Clinical Impression: 1. Dizziness   2. Falls     Disposition: ED to ED transfer for MRI brain without contrast as recommended  by neurology.  If MRI is negative, plan for discharge home for outpatient management of suspected peripheral vertigo with ENT follow-up and likely prescription for meclizine.  Care transferred in stable  condition.  Accepted by Dr. Elnor  This note was prepared with assistance of Dragon voice recognition software. Occasional wrong-word or sound-a-like substitutions may have occurred due to the inherent limitations of voice recognition software.     Hashim Eichhorst, Lonni PARAS, MD 05/04/24 [phone]

## 2024-05-04 NOTE — ED Provider Notes (Signed)
  Physical Exam  BP 126/80 (BP Location: Right Arm)   Pulse 79   Temp 98.2 F (36.8 C) (Oral)   Resp 18   SpO2 95%   Physical Exam Cardiovascular:     Rate and Rhythm: Normal rate and regular rhythm.  Pulmonary:     Effort: Pulmonary effort is normal.     Breath sounds: Normal breath sounds.     Procedures  Procedures  ED Course / MDM    Medical Decision Making Amount and/or Complexity of Data Reviewed Labs: ordered. Radiology: ordered.  Risk Prescription drug management.   Patient is coming from [facility] room evaluated by Dr Harrell and sent for MRI of the brain due to dizziness/vertigo.  Her workup has thus far been reassuring. Neurology has been consulted.  Patient is feeling better after meclizine and Zofran  but continues to have some mild dizziness.   MRI brain no acute findings.   Plan if MRI of the brain is negative for outpatient follow-up with ENT per prior ED provider's note. Vitals stable, well appearing.        Shermon Warren SAILOR, PA-C 05/04/24 1429    Neysa Caron PARAS, DO 05/04/24 1625

## 2024-05-04 NOTE — ED Notes (Signed)
 Patient transported to MRI

## 2024-05-04 NOTE — ED Notes (Signed)
 Pt arrived to [facility], A&Ox4, c/o slight headache and fuzziness. Pt denies changes. MRI notified of pt's arrival.

## 2024-05-04 NOTE — ED Triage Notes (Signed)
 Pt states that she woke up at 430 am with severe dizziness, room spinning, LSN 130 am, denies headache, neuro intact bilaterally.

## 2024-05-04 NOTE — Discharge Instructions (Signed)
 Please call ears nose and throat for further evaluation of your vertigo.  Please take meclizine as needed for dizziness.  Take Zofran  as needed for nausea or vomiting.    Follow-up with primary care and return to emergency room with new or worsening symptoms.

## 2024-05-04 NOTE — ED Notes (Signed)
Pt is aware that urine sample is needed. 

## 2024-05-10 DIAGNOSIS — E785 Hyperlipidemia, unspecified: Secondary | ICD-10-CM | POA: Diagnosis not present

## 2024-05-10 DIAGNOSIS — F064 Anxiety disorder due to known physiological condition: Secondary | ICD-10-CM | POA: Diagnosis not present

## 2024-05-10 DIAGNOSIS — E860 Dehydration: Secondary | ICD-10-CM | POA: Diagnosis not present

## 2024-05-10 DIAGNOSIS — R7309 Other abnormal glucose: Secondary | ICD-10-CM | POA: Diagnosis not present

## 2024-05-10 DIAGNOSIS — H8393 Unspecified disease of inner ear, bilateral: Secondary | ICD-10-CM | POA: Diagnosis not present

## 2024-05-15 ENCOUNTER — Ambulatory Visit (INDEPENDENT_AMBULATORY_CARE_PROVIDER_SITE_OTHER): Admitting: Otolaryngology

## 2024-05-15 ENCOUNTER — Encounter (INDEPENDENT_AMBULATORY_CARE_PROVIDER_SITE_OTHER): Payer: Self-pay | Admitting: Otolaryngology

## 2024-05-15 VITALS — BP 127/85 | HR 62 | Temp 97.4°F | Ht 63.0 in | Wt 154.0 lb

## 2024-05-15 DIAGNOSIS — H903 Sensorineural hearing loss, bilateral: Secondary | ICD-10-CM

## 2024-05-15 DIAGNOSIS — H9193 Unspecified hearing loss, bilateral: Secondary | ICD-10-CM

## 2024-05-15 DIAGNOSIS — H6993 Unspecified Eustachian tube disorder, bilateral: Secondary | ICD-10-CM

## 2024-05-15 DIAGNOSIS — R42 Dizziness and giddiness: Secondary | ICD-10-CM

## 2024-05-15 MED ORDER — MOMETASONE FUROATE 50 MCG/ACT NA SUSP: 2.0000 | Freq: Every day | NASAL | 12 refills | Status: AC

## 2024-05-15 NOTE — Progress Notes (Signed)
 Reason for Consult: Hearing loss/vertigo Referring Physician: Dr. Benjamine Claretta JULIANNA Carrie Murillo is an 63 y.o. female.  HPI: History of hearing loss for about 6 months.  It seems to be in both ears right worse than the left though.  She noticed the hearing loss about 6 months ago and it has not changed since then.  She has occasional tinnitus.  She does have pressure in her ears and many times she can get her hearing to come back to normal by Valsalva maneuver.  She does have nose congestion and postnasal drip.  There is not any purulence.  No facial pain or pressure.  She had a vertigo episode 1 week ago.  She went to the emergency room.  She had just a few hours of the vertigo at most.  She has not had any since then.  She did not have change in her hearing with the episode.  She also did not have increase in tinnitus.  She does have hypertension.  Past Medical History:  Diagnosis Date   Dysrhythmia    pt feels heart flutter 2-3 times a week   Family history of breast cancer    Family history of colon cancer    Family history of genetic disease carrier    Hypertension     Past Surgical History:  Procedure Laterality Date   BREAST BIOPSY Left 11/16/2023   MM LT BREAST BX W LOC DEV 1ST LESION IMAGE BX SPEC STEREO GUIDE 11/16/2023 [facility]   BREAST BIOPSY Left 11/16/2023   MM LT BREAST BX W LOC DEV EA AD LESION IMG BX SPEC STEREO GUIDE 11/16/2023 [facility]   EYE SURGERY     cataracts as a child   HEMORROIDECTOMY  1970s   REDUCTION MAMMAPLASTY     ROBOTIC ASSISTED TOTAL HYSTERECTOMY WITH BILATERAL SALPINGO OOPHERECTOMY N/A 04/24/2019   Procedure: XI ROBOTIC ASSISTED TOTAL HYSTERECTOMY greater than 250g, WITH BILATERAL SALPINGO OOPHORECTOMY, LYSIS OF ADHESIONS;  Surgeon: Eloy Herring, MD;  Location: [facility];  Service: Gynecology;  Laterality: N/A;   UTERINE FIBROID SURGERY  200,2010    Family History  Problem Relation Age of Onset   Breast cancer Mother 42       dx late 06-15-24, died  at 57   Breast cancer Maternal Aunt 28       died at 69   Breast cancer Cousin 79   Colon cancer Paternal Aunt        dx over 13   Colon cancer Paternal Uncle        dx over 36   Other Other        genetic test positive for a mutation- patient does not know which mutation   Cancer Maternal Aunt        5 other maternal aunts all with cancer- types unk    Social History:  reports that she has never smoked. She has never used smokeless tobacco. She reports that she does not drink alcohol and does not use drugs.  Allergies: No Known Allergies  Medications: I have reviewed the patient's current medications.  No results found for this or any previous visit (from the past 48 hours).  No results found.  ROS Blood pressure 127/85, pulse 62, temperature (!) 97.4 F (36.3 C), height 5' 3 (1.6 m), weight 154 lb (69.9 kg), SpO2 98%. Physical Exam Constitutional:      Appearance: Normal appearance.  HENT:     Head: Normocephalic and atraumatic.     Right  Ear: Tympanic membrane is without lesions and middle ear aerated, ear canal and external ear normal.     Left Ear: Tympanic membrane is without lesions and middle ear aerated, ear canal and external ear normal.     Nose: Nose normal. Turbinates with mild hypertrophy, No significant swelling or masses.     Oral cavity/oropharynx: Mucous membranes are moist. No lesions or masses    Larynx: normal voice. Mirror attempted without success    Eyes:     Extraocular Movements: Extraocular movements intact.     Conjunctiva/sclera: Conjunctivae normal.     Pupils: Pupils are equal, round, and reactive to light.  Cardiovascular:     Rate and Rhythm: Normal rate.  Pulmonary:     Effort: Pulmonary effort is normal.  Musculoskeletal:     Cervical back: Normal range of motion and neck supple. No rigidity.  Lymphadenopathy:     Cervical: No cervical adenopathy or masses.salivary glands without lesions. .  Neurological:     Mental Status: He is  alert. CN 2-12 intact. No nystagmus      Assessment/Plan: Hearing loss/vertigo-the vertigo is very temporary and I doubt that has a relationship to the hearing loss specifically.  Most likely that is secondary to medications or other etiology.  It is at this point resolved.  She did have scans in the hospital with no evidence of lesions.  Her MRI scan did not show any problem.  I am going to order an audiogram and she will follow-up after that study.  Eustachian tube dysfunction-she has a lot of nasal symptoms and likely has a component of eustachian tube which could be entirely her complaint of hearing loss.  She resolves the hearing loss with Valsalva maneuvers.  She will start Nasonex and Allegra and will follow-up after the audiogram.  Norleen Notice 05/15/2024, 11:01 AM

## 2024-06-19 ENCOUNTER — Ambulatory Visit (INDEPENDENT_AMBULATORY_CARE_PROVIDER_SITE_OTHER): Admitting: Audiology

## 2024-06-19 DIAGNOSIS — H903 Sensorineural hearing loss, bilateral: Secondary | ICD-10-CM | POA: Diagnosis not present

## 2024-06-19 DIAGNOSIS — H90A21 Sensorineural hearing loss, unilateral, right ear, with restricted hearing on the contralateral side: Secondary | ICD-10-CM

## 2024-06-19 NOTE — Progress Notes (Signed)
" °  [address], [address] [place], [place] [address] [phone]  Audiological Evaluation    Name: Carrie Murillo     DOB:   Nov 25, 1960      MRN:   [id number]                                                                                     Service Date: 06/19/2024     Accompanied by: husband   Patient comes today after Dr. Roark, ENT sent a referral for a hearing evaluation due to concerns with Eustachian tube dysfunction.   Symptoms Yes Details  Hearing loss  []  Not sure  Tinnitus  [x]  Sometimes ringing in the right ear - for maybe a year  Ear pain/ infections/pressure  []    Balance problems  [x]  Vertigo 3 weeks ago with a spinning sensation when she woke up in the middle of the night . She went to the ER.  Noise exposure history  []    Previous ear surgeries  []    Family history of hearing loss  []    Amplification  []    Other  [x]  Sinus issues    Otoscopy: Right ear: Clear external ear canal and notable landmarks visualized on the tympanic membrane. Noted left sided mole. Left ear:  Clear external ear canal and notable landmarks visualized on the tympanic membrane.  Tympanometry: Right ear: Type A - Normal external ear canal volume with normal middle ear pressure and normal tympanic membrane compliance. Findings are consistent with normal middle ear function. Left ear: Type A - Normal external ear canal volume with normal middle ear pressure and normal tympanic membrane compliance. Findings are consistent with normal middle ear function.  Hearing Evaluation The hearing test results were completed under headphones and results are deemed to be of good reliability. Test technique:  conventional    Pure tone Audiometry: Right ear- Mild sensorineural hearing loss from 250 Hz - 8000 Hz. Left ear-  Borderline/mild sensorineural hearing loss from 250 Hz - 8000 Hz.  Speech Audiometry: Right ear- Speech Reception Threshold (SRT) was obtained at 25 dBHL. Left ear-Speech  Reception Threshold (SRT) was obtained at 25 dBHL.   Word Recognition Score Tested using NU-6 (recorded) Right ear: 100% was obtained at a presentation level of 65 dBHL with contralateral masking which is deemed as  excellent. Left ear: 100% was obtained at a presentation level of 65 dBHL with contralateral masking which is deemed as  excellent.   Impression: There is not a significant difference in pure-tone thresholds between ears., There is not a significant difference in the word recognition score in between ears.    Recommendations: Follow up with ENT as scheduled. Return for a hearing evaluation if concerns with hearing changes arise or per MD recommendation. Consider a communication needs assessment for amplification after medical clearance is obtained, if needed.   Carrie Murillo Carrie Murillo, AUD  "

## 2024-09-20 ENCOUNTER — Ambulatory Visit: Admitting: Neurology
